# Patient Record
Sex: Male | Born: 1985 | Race: Black or African American | Hispanic: No | Marital: Single | Smoking: Never smoker
Health system: Southern US, Community
[De-identification: ages and names within clinical notes are randomized; demographics above are authoritative.]

## PROBLEM LIST (undated history)

## (undated) DIAGNOSIS — A809 Acute poliomyelitis, unspecified: Secondary | ICD-10-CM

## (undated) DIAGNOSIS — A1801 Tuberculosis of spine: Secondary | ICD-10-CM

## (undated) HISTORY — PX: ORIF FEMUR FRACTURE: SHX2119

## (undated) HISTORY — PX: SPINAL FUSION: SHX223

## (undated) HISTORY — PX: TIBIA IM NAIL INSERTION: SHX2516

---

## 2006-01-04 ENCOUNTER — Emergency Department (HOSPITAL_COMMUNITY): Admission: EM | Admit: 2006-01-04 | Discharge: 2006-01-04 | Payer: Self-pay | Admitting: Emergency Medicine

## 2007-07-08 IMAGING — CR DG HIP (WITH OR WITHOUT PELVIS) 2-3V*L*
3 series · 3 of 3 positions shown · non-contrast
Comparison: None.

CLINICAL DATA: Hip pain.  Congenital defect.  Question dislocation.  
LEFT HIP ? 3 VIEW:

[view not recorded (1 of 3)]
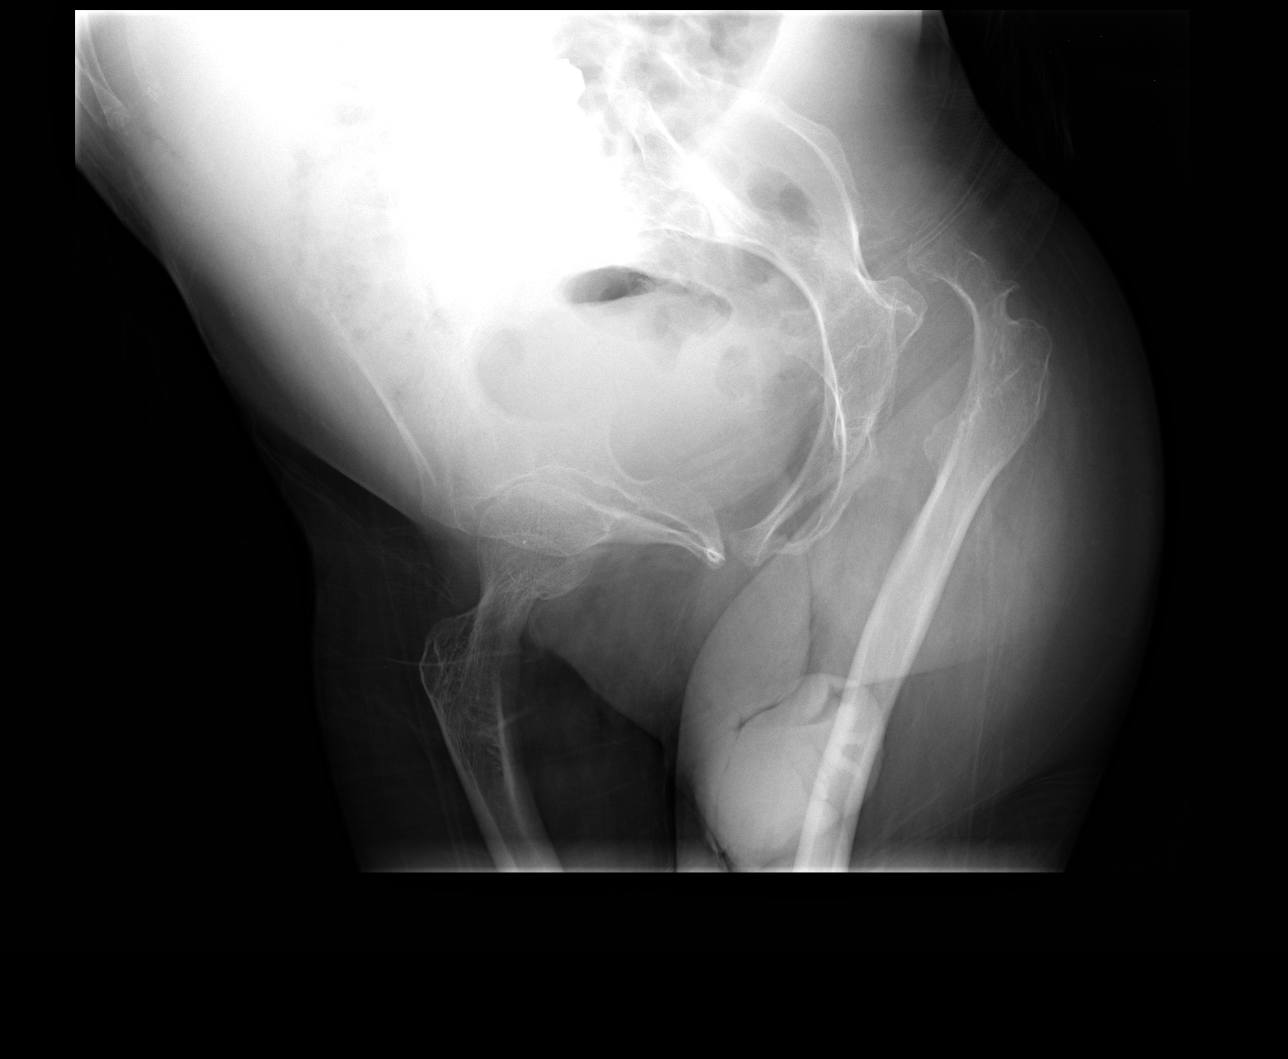

[view not recorded (2 of 3)]
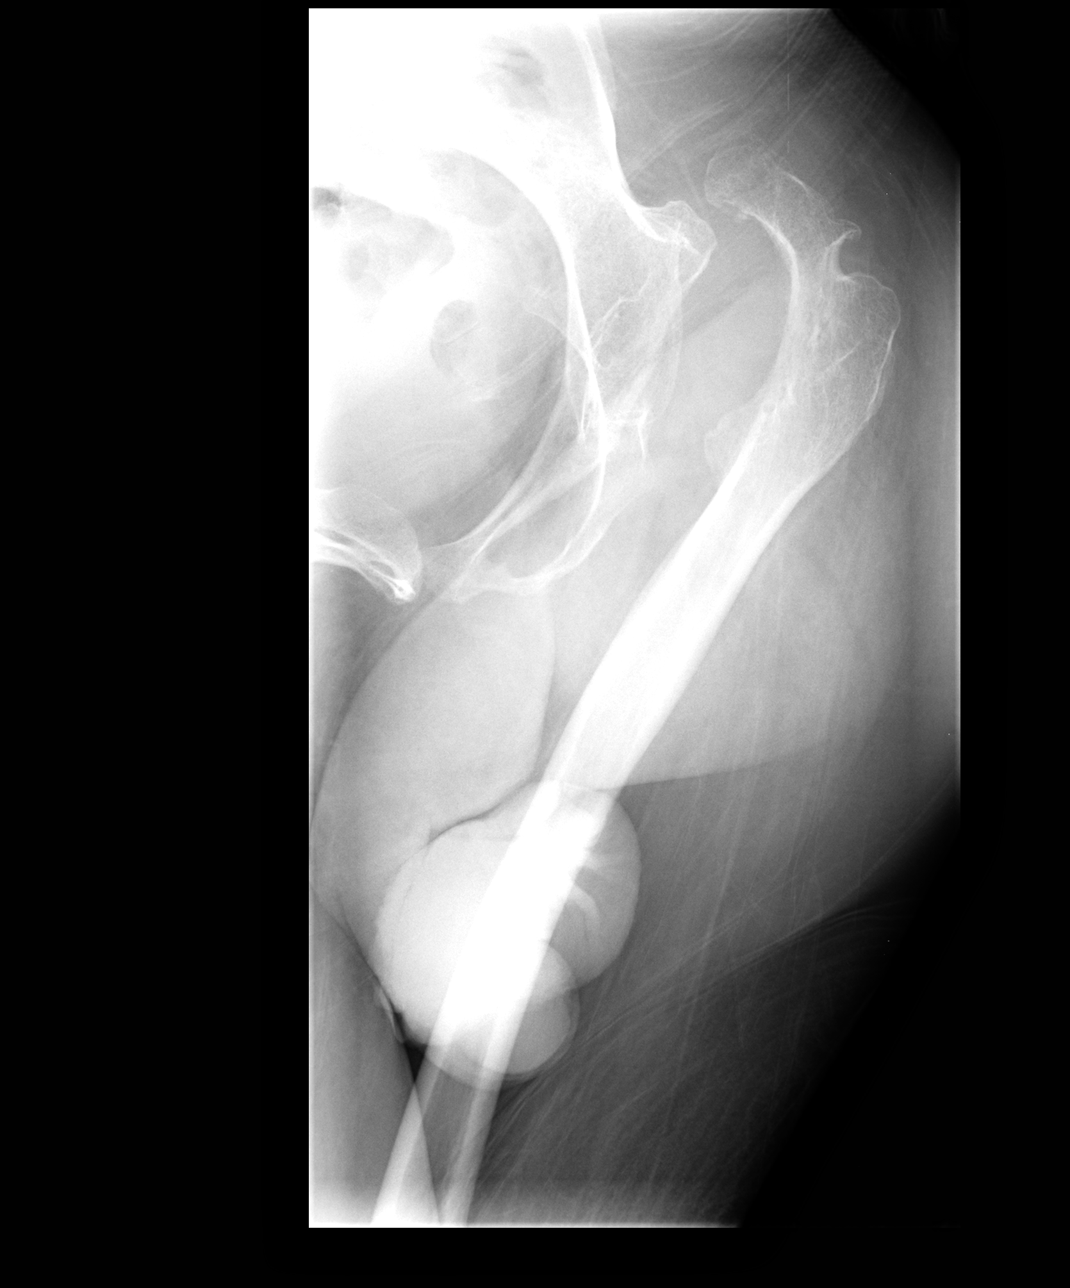

[view not recorded (3 of 3)]
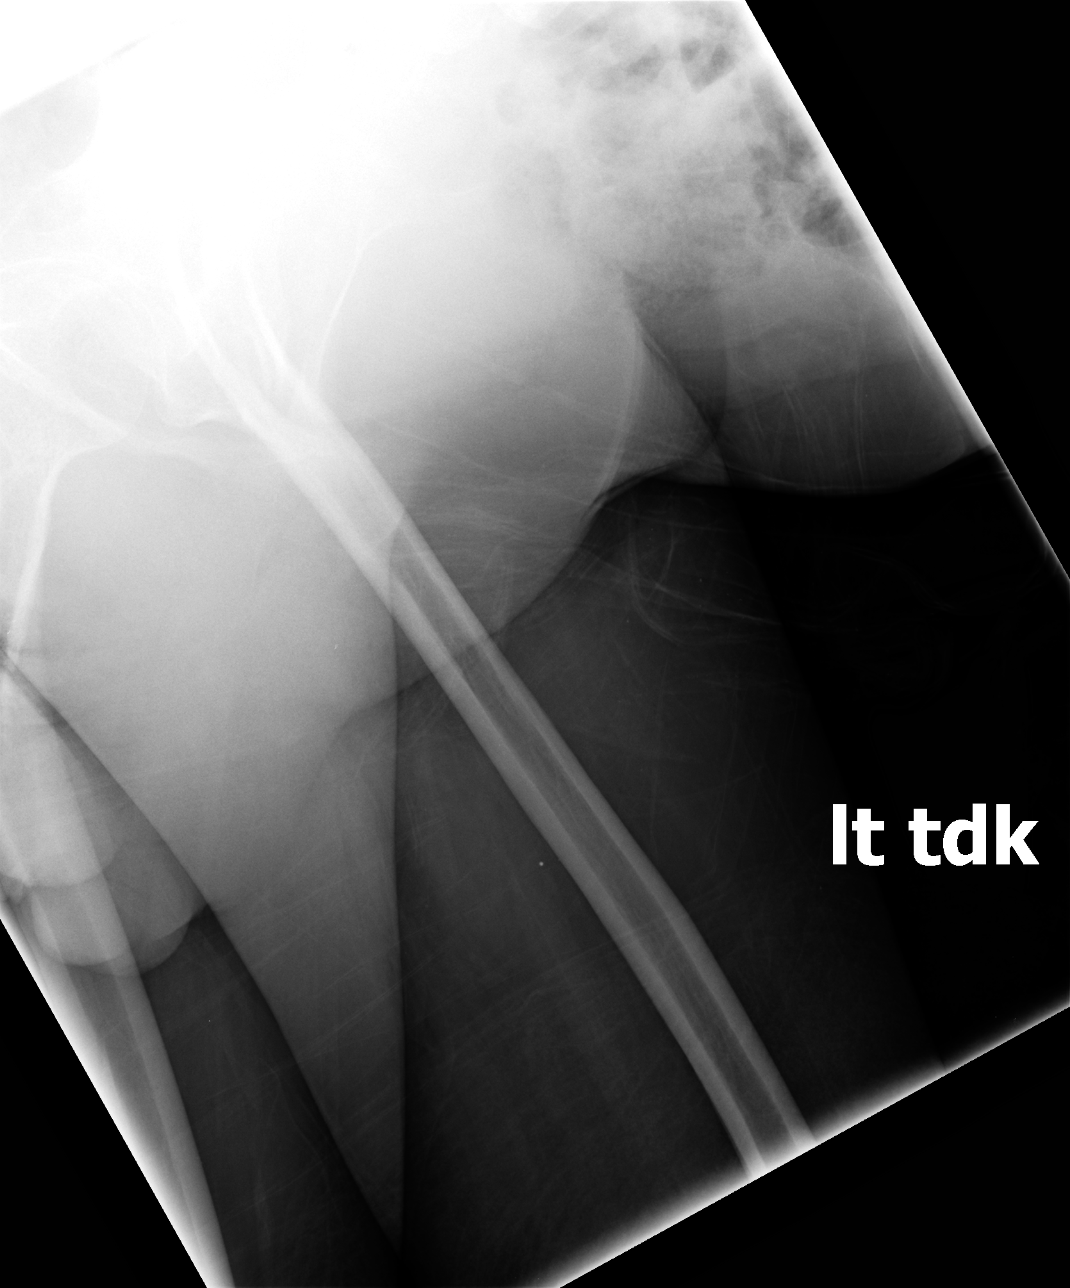

[3 of 3 positions shown; findings below may reference images not displayed]

FINDINGS: Marked osteopenia.  Markedly distorted pelvis consistent with a given history of congenital defects.  There is superolateral position of the left femur likely within a pseudoarthrosis above the left acetabulum.  Abnormal appearance of the left femoral head consistent with congenital mal-positioning.
IMPRESSION: 1.  The left femur appears to partially articulate with pseudoarthrosis superolateral to the left acetabulum.   
2.    Dysmorphic pelvis without evidence of acute fracture.  orphology, this is likely at least a partially chronic finding.  No acute fracture.

## 2019-01-12 ENCOUNTER — Other Ambulatory Visit: Payer: Self-pay

## 2019-01-12 DIAGNOSIS — Z20822 Contact with and (suspected) exposure to covid-19: Secondary | ICD-10-CM

## 2019-01-13 LAB — NOVEL CORONAVIRUS, NAA: SARS-CoV-2, NAA: NOT DETECTED

## 2019-01-26 ENCOUNTER — Other Ambulatory Visit: Payer: Self-pay

## 2019-01-26 DIAGNOSIS — Z20822 Contact with and (suspected) exposure to covid-19: Secondary | ICD-10-CM

## 2019-01-29 LAB — NOVEL CORONAVIRUS, NAA: SARS-CoV-2, NAA: NOT DETECTED

## 2019-02-27 ENCOUNTER — Ambulatory Visit: Payer: Managed Care, Other (non HMO) | Attending: Internal Medicine

## 2019-02-27 DIAGNOSIS — Z20822 Contact with and (suspected) exposure to covid-19: Secondary | ICD-10-CM

## 2019-03-01 LAB — NOVEL CORONAVIRUS, NAA: SARS-CoV-2, NAA: NOT DETECTED

## 2019-05-25 ENCOUNTER — Ambulatory Visit: Payer: Managed Care, Other (non HMO) | Attending: Internal Medicine

## 2019-05-25 DIAGNOSIS — Z23 Encounter for immunization: Secondary | ICD-10-CM

## 2019-05-25 NOTE — Progress Notes (Signed)
   Covid-19 Vaccination Clinic  Name:  Alexander Mcgrath    MRN: [id number] DOB: Aug 05, 1985  05/25/2019  [name] was observed post Covid-19 immunization for 15 minutes without incident. He was provided with Vaccine Information Sheet and instruction to access the V-Safe system.   [name] was instructed to call 911 with any severe reactions post vaccine: Marland Kitchen Difficulty breathing  . Swelling of face and throat  . A fast heartbeat  . A bad rash all over body  . Dizziness and weakness   Immunizations Administered    Name Date Dose VIS Date Route   Pfizer COVID-19 Vaccine 05/25/2019  4:43 PM 0.3 mL 02/16/2019 Intramuscular   Manufacturer: [employer], [employer]   Lot: [id number]   NDC: [address]-7

## 2019-06-20 ENCOUNTER — Ambulatory Visit: Payer: Managed Care, Other (non HMO) | Attending: Internal Medicine

## 2019-06-20 DIAGNOSIS — Z23 Encounter for immunization: Secondary | ICD-10-CM

## 2019-06-20 NOTE — Progress Notes (Signed)
   Covid-19 Vaccination Clinic  Name:  Alexander Mcgrath    MRN: [id number] DOB: 1985/06/08  06/20/2019  [name] was observed post Covid-19 immunization for 15 minutes without incident. He was provided with Vaccine Information Sheet and instruction to access the V-Safe system.   [name] was instructed to call 911 with any severe reactions post vaccine: Marland Kitchen Difficulty breathing  . Swelling of face and throat  . A fast heartbeat  . A bad rash all over body  . Dizziness and weakness   Immunizations Administered    Name Date Dose VIS Date Route   Pfizer COVID-19 Vaccine 06/20/2019  3:37 PM 0.3 mL 02/16/2019 Intramuscular   Manufacturer: [employer], [employer]   Lot: [id number]   NDC: [address]-5

## 2019-12-15 DIAGNOSIS — M778 Other enthesopathies, not elsewhere classified: Secondary | ICD-10-CM | POA: Diagnosis not present

## 2019-12-15 DIAGNOSIS — M25511 Pain in right shoulder: Secondary | ICD-10-CM | POA: Diagnosis not present

## 2020-05-14 DIAGNOSIS — Z20822 Contact with and (suspected) exposure to covid-19: Secondary | ICD-10-CM | POA: Diagnosis not present

## 2020-05-14 DIAGNOSIS — Z03818 Encounter for observation for suspected exposure to other biological agents ruled out: Secondary | ICD-10-CM | POA: Diagnosis not present

## 2020-07-07 DIAGNOSIS — M25511 Pain in right shoulder: Secondary | ICD-10-CM | POA: Diagnosis not present

## 2020-07-18 DIAGNOSIS — S46011D Strain of muscle(s) and tendon(s) of the rotator cuff of right shoulder, subsequent encounter: Secondary | ICD-10-CM | POA: Diagnosis not present

## 2021-05-22 DIAGNOSIS — T24231A Burn of second degree of right lower leg, initial encounter: Secondary | ICD-10-CM | POA: Diagnosis not present

## 2022-10-08 ENCOUNTER — Other Ambulatory Visit: Payer: Self-pay

## 2022-10-08 ENCOUNTER — Emergency Department (HOSPITAL_BASED_OUTPATIENT_CLINIC_OR_DEPARTMENT_OTHER): Payer: BC Managed Care – PPO

## 2022-10-08 ENCOUNTER — Emergency Department (HOSPITAL_BASED_OUTPATIENT_CLINIC_OR_DEPARTMENT_OTHER)
Admission: EM | Admit: 2022-10-08 | Discharge: 2022-10-08 | Disposition: A | Discharge status: Home / Self Care | Payer: BC Managed Care – PPO | Attending: Emergency Medicine | Admitting: Emergency Medicine

## 2022-10-08 DIAGNOSIS — R202 Paresthesia of skin: Secondary | ICD-10-CM | POA: Diagnosis not present

## 2022-10-08 DIAGNOSIS — R200 Anesthesia of skin: Secondary | ICD-10-CM | POA: Diagnosis not present

## 2022-10-08 DIAGNOSIS — R531 Weakness: Secondary | ICD-10-CM | POA: Insufficient documentation

## 2022-10-08 DIAGNOSIS — R519 Headache, unspecified: Secondary | ICD-10-CM | POA: Diagnosis not present

## 2022-10-08 LAB — CBC
HCT: 41.8 % (ref 39.0–52.0)
Hemoglobin: 14.1 g/dL (ref 13.0–17.0)
MCH: 29.9 pg (ref 26.0–34.0)
MCHC: 33.7 g/dL (ref 30.0–36.0)
MCV: 88.7 fL (ref 80.0–100.0)
Platelets: 250 10*3/uL (ref 150–400)
RBC: 4.71 MIL/uL (ref 4.22–5.81)
RDW: 12.5 % (ref 11.5–15.5)
WBC: 7.2 10*3/uL (ref 4.0–10.5)
nRBC: 0 % (ref 0.0–0.2)

## 2022-10-08 LAB — BASIC METABOLIC PANEL
Anion gap: 10 (ref 5–15)
BUN: 10 mg/dL (ref 6–20)
CO2: 25 mmol/L (ref 22–32)
Calcium: 9.7 mg/dL (ref 8.9–10.3)
Chloride: 105 mmol/L (ref 98–111)
Creatinine, Ser: 0.55 mg/dL — ABNORMAL LOW (ref 0.61–1.24)
GFR, Estimated: >60 mL/min (ref >60)
Glucose, Bld: 92 mg/dL (ref 70–99)
Potassium: 3.8 mmol/L (ref 3.5–5.1)
Sodium: 140 mmol/L (ref 135–145)

## 2022-10-08 LAB — CBG MONITORING, ED: Glucose-Capillary: 81 mg/dL (ref 70–99)

## 2022-10-08 MED ORDER — SODIUM CHLORIDE 0.9 % IV BOLUS
1000.0000 mL | Freq: Once | INTRAVENOUS | Status: AC
  Administered 2022-10-08: 1000 mL via INTRAVENOUS

## 2022-10-08 NOTE — ED Notes (Signed)
Provider at bedside for evaluation.

## 2022-10-08 NOTE — Discharge Instructions (Signed)
Please return if symptoms worsen as discussed.  Follow-up with neurology.

## 2022-10-08 NOTE — ED Notes (Signed)
Unable to obtain EKG at triage will try again when the patient is in the exam room.

## 2022-10-08 NOTE — ED Provider Notes (Addendum)
Alexander Mcgrath Provider Note   CSN: [id number] Arrival date & time: 10/08/22  1808     History  Chief Complaint  Patient presents with   Numbness   Weakness    Alexander Mcgrath is a 37 y.o. male.  Patient here with numbness.  Having some numbness to his left lower leg around the left inner calf, some tingling and numbness in his right and left hand.  Had some jaw pain yesterday that resolved, some generalized weakness but no unilateral weakness.  History of spinal TB and polio.  He denies any headache fever or chills.  Nothing makes it worse or better.  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  Is wheelchair-bound.  Uses a manual wheelchair.  Denies any chest pain or shortness of breath.  Denies any back pain or neck pain.  Denies any vision loss or speech changes.  The history is provided by the patient.       Home Medications Prior to Admission medications   Not on File      Allergies    Patient has no known allergies.    Review of Systems   Review of Systems  Physical Exam Updated Vital Signs BP 123/81   Pulse 69   Temp 97.9 F (36.6 C) (Oral)   Resp 18   Ht 5\' 1"  (1.549 m)   Wt 45.4 kg   SpO2 100%   BMI 18.89 kg/m  Physical Exam Vitals and nursing note reviewed.  Constitutional:      General: He is not in acute distress.    Appearance: He is well-developed. He is not ill-appearing.  HENT:     Head: Normocephalic and atraumatic.     Nose: Nose normal.     Mouth/Throat:     Mouth: Mucous membranes are moist.  Eyes:     Extraocular Movements: Extraocular movements intact.     Conjunctiva/sclera: Conjunctivae normal.     Pupils: Pupils are equal, round, and reactive to light.  Cardiovascular:     Rate and Rhythm: Normal rate and regular rhythm.     Pulses: Normal pulses.     Heart sounds: Normal heart sounds. No murmur heard. Pulmonary:     Effort: Pulmonary effort is normal. No respiratory distress.      Breath sounds: Normal breath sounds.  Abdominal:     Palpations: Abdomen is soft.     Tenderness: There is no abdominal tenderness.  Musculoskeletal:        General: No swelling.     Cervical back: Normal range of motion and neck supple. No tenderness.  Skin:    General: Skin is warm and dry.     Capillary Refill: Capillary refill takes less than 2 seconds.  Neurological:     General: No focal deficit present.     Mental Status: He is alert.     Comments: Overall he has 5+ out of 5 strength in his upper extremities, seems like he has symmetric strength in his lower extremities, has had a little bit of numbness to the left medial calf and a little bit of numbness to his left and right thumb but no other obvious numbness on exam, speech is normal, visual fields are normal, cranial nerves are intact  Psychiatric:        Mood and Affect: Mood normal.     ED Results / Procedures / Treatments   Labs (all labs ordered are listed, but only abnormal  results are displayed) Labs Reviewed  BASIC METABOLIC PANEL - Abnormal; Notable for the following components:      Result Value   Creatinine, Ser 0.55 (*)    All other components within normal limits  CBC  CBG MONITORING, ED    EKG None  Radiology CT Head Wo Contrast  Result Date: 10/08/2022 CLINICAL DATA:  Headache. EXAM: CT HEAD WITHOUT CONTRAST TECHNIQUE: Contiguous axial images were obtained from the base of the skull through the vertex without intravenous contrast. RADIATION DOSE REDUCTION: This exam was performed according to the departmental dose-optimization program which includes automated exposure control, adjustment of the mA and/or kV according to patient size and/or use of iterative reconstruction technique. COMPARISON:  No comparison studies available. FINDINGS: Brain: There is no evidence for acute hemorrhage, hydrocephalus, mass lesion, or abnormal extra-axial fluid collection. No definite CT evidence for acute infarction.  Vascular: No hyperdense vessel or unexpected calcification. Skull: No evidence for fracture. No worrisome lytic or sclerotic lesion. Sinuses/Orbits: The visualized paranasal sinuses and mastoid air cells are clear. Visualized portions of the globes and intraorbital fat are unremarkable. Other: None. IMPRESSION: Unremarkable head CT. No acute intracranial abnormality. Electronically Signed   By: Kennith Center M.D.   On: 10/08/2022 19:11    Procedures Procedures    Medications Ordered in ED Medications  sodium chloride 0.9 % bolus 1,000 mL (0 mLs Intravenous Stopped 10/08/22 2042)    ED Course/ Medical Decision Making/ A&P                                 Medical Decision Making Amount and/or Complexity of Data Reviewed Labs: ordered. Radiology: ordered.   Alexander Mcgrath is here with numbness and tingling.  Normal vitals.  No fever.  History of spinal TB and polio.  He is wheelchair-bound.  Overall describes patchy numbness in his hands and left lower leg last few days starting yesterday morning.  Mostly resolved but still with some tingling in his hands at times in his left inner calf.  Feels some generalized weakness when doing transfers from his wheelchair.  Works as a Psychologist, occupational.  His neuroexam is overall unremarkable.  He has some decrease sensation in the left inner calf when compared to his right leg.  Maybe some numbness in his thumbs.  Overall pattern does not appear consistent with stroke.  Does not appear consistent with spinal process.  Does not have any neck pain or back pain.  I do not have any concern for cord compression or stroke at this time.  Seems unlikely to be something like MS.  He had a head CT done that was unremarkable per radiology report.  Basic labs show no significant anemia or electrolyte abnormality or kidney injury.  Felt better after IV fluids.  Overall shared decision was made to hold off on any further imaging including MRI.  We discussed getting an MRI to further  rule out stroke but ultimately decision was made to hold off on that and refer him to neurology outpatient.  Numbness in his hands could be from work as he works as a Psychologist, occupational.  He uses a manual wheelchair.  Could be some median nerve neuropathy or peripheral neuropathy causing this.  MS seems less likely but given the patchy numbness may be something that could be considered outpatient.  Overall he was given return precautions.  He will be referred to neurology.  Discharged in good condition.  Understands return precautions.  I have no concern for infectious process.  This chart was dictated using voice recognition software.  Despite best efforts to proofread,  errors can occur which can change the documentation meaning.         Final Clinical Impression(s) / ED Diagnoses Final diagnoses:  Numbness    Rx / DC Orders ED Discharge Orders          Ordered    Ambulatory referral to Neurology       Comments: An appointment is requested in approximately: 2 weeks   10/08/22 2107              Virgina Norfolk, DO 10/08/22 2111    Virgina Norfolk, DO 10/08/22 2111

## 2022-10-08 NOTE — ED Triage Notes (Addendum)
Pt to ED c/o numbness and generalized weakness that started yesterday at 5am. Reports numbness in both hands and left leg. Pt reports difficulty transferring self to chair due to weakness. Pt A&O x 4 in triage. No s/s of acute distress noted in triage. Pt reports jaw pain yesterday that has now gotten better.

## 2022-10-08 NOTE — ED Notes (Signed)
Reviewed AVS with patient, patient expressed understanding of directions, denies further questions at this time. 

## 2022-10-12 ENCOUNTER — Emergency Department (HOSPITAL_COMMUNITY): Payer: BC Managed Care – PPO

## 2022-10-12 ENCOUNTER — Encounter (HOSPITAL_COMMUNITY): Payer: Self-pay

## 2022-10-12 ENCOUNTER — Other Ambulatory Visit: Payer: Self-pay

## 2022-10-12 ENCOUNTER — Inpatient Hospital Stay (HOSPITAL_COMMUNITY): Payer: BC Managed Care – PPO

## 2022-10-12 ENCOUNTER — Inpatient Hospital Stay (HOSPITAL_COMMUNITY)
Admission: EM | Admit: 2022-10-12 | Discharge: 2022-10-19 | DRG: 096 | Disposition: A | Discharge status: Rehabilitation Facility | Payer: BC Managed Care – PPO | Attending: Infectious Diseases | Admitting: Infectious Diseases

## 2022-10-12 DIAGNOSIS — Z1152 Encounter for screening for COVID-19: Secondary | ICD-10-CM | POA: Diagnosis not present

## 2022-10-12 DIAGNOSIS — K5909 Other constipation: Secondary | ICD-10-CM | POA: Diagnosis not present

## 2022-10-12 DIAGNOSIS — M4802 Spinal stenosis, cervical region: Secondary | ICD-10-CM | POA: Diagnosis not present

## 2022-10-12 DIAGNOSIS — Z79899 Other long term (current) drug therapy: Secondary | ICD-10-CM | POA: Diagnosis not present

## 2022-10-12 DIAGNOSIS — M50323 Other cervical disc degeneration at C6-C7 level: Secondary | ICD-10-CM | POA: Diagnosis not present

## 2022-10-12 DIAGNOSIS — R29810 Facial weakness: Secondary | ICD-10-CM | POA: Diagnosis not present

## 2022-10-12 DIAGNOSIS — R471 Dysarthria and anarthria: Secondary | ICD-10-CM | POA: Diagnosis not present

## 2022-10-12 DIAGNOSIS — R200 Anesthesia of skin: Secondary | ICD-10-CM

## 2022-10-12 DIAGNOSIS — Z993 Dependence on wheelchair: Secondary | ICD-10-CM

## 2022-10-12 DIAGNOSIS — Z681 Body mass index (BMI) 19 or less, adult: Secondary | ICD-10-CM | POA: Diagnosis not present

## 2022-10-12 DIAGNOSIS — F419 Anxiety disorder, unspecified: Secondary | ICD-10-CM | POA: Diagnosis present

## 2022-10-12 DIAGNOSIS — R4702 Dysphasia: Secondary | ICD-10-CM | POA: Diagnosis not present

## 2022-10-12 DIAGNOSIS — Z8612 Personal history of poliomyelitis: Secondary | ICD-10-CM | POA: Diagnosis not present

## 2022-10-12 DIAGNOSIS — R531 Weakness: Secondary | ICD-10-CM

## 2022-10-12 DIAGNOSIS — Z981 Arthrodesis status: Secondary | ICD-10-CM | POA: Diagnosis not present

## 2022-10-12 DIAGNOSIS — R1310 Dysphagia, unspecified: Secondary | ICD-10-CM | POA: Diagnosis present

## 2022-10-12 DIAGNOSIS — G610 Guillain-Barre syndrome: Secondary | ICD-10-CM | POA: Diagnosis not present

## 2022-10-12 DIAGNOSIS — R202 Paresthesia of skin: Secondary | ICD-10-CM | POA: Diagnosis not present

## 2022-10-12 DIAGNOSIS — Z8611 Personal history of tuberculosis: Secondary | ICD-10-CM

## 2022-10-12 DIAGNOSIS — M50223 Other cervical disc displacement at C6-C7 level: Secondary | ICD-10-CM | POA: Diagnosis not present

## 2022-10-12 DIAGNOSIS — H538 Other visual disturbances: Secondary | ICD-10-CM | POA: Diagnosis not present

## 2022-10-12 DIAGNOSIS — M419 Scoliosis, unspecified: Secondary | ICD-10-CM | POA: Diagnosis not present

## 2022-10-12 DIAGNOSIS — R4589 Other symptoms and signs involving emotional state: Secondary | ICD-10-CM | POA: Diagnosis not present

## 2022-10-12 DIAGNOSIS — M79601 Pain in right arm: Secondary | ICD-10-CM | POA: Diagnosis present

## 2022-10-12 DIAGNOSIS — M79602 Pain in left arm: Secondary | ICD-10-CM | POA: Diagnosis not present

## 2022-10-12 DIAGNOSIS — R609 Edema, unspecified: Secondary | ICD-10-CM | POA: Diagnosis not present

## 2022-10-12 DIAGNOSIS — E440 Moderate protein-calorie malnutrition: Secondary | ICD-10-CM | POA: Diagnosis not present

## 2022-10-12 DIAGNOSIS — G8220 Paraplegia, unspecified: Secondary | ICD-10-CM | POA: Diagnosis not present

## 2022-10-12 HISTORY — DX: Tuberculosis of spine: A18.01

## 2022-10-12 HISTORY — DX: Acute poliomyelitis, unspecified: A80.9

## 2022-10-12 LAB — TSH: TSH: 4.117 u[IU]/mL (ref 0.350–4.500)

## 2022-10-12 LAB — CBC WITH DIFFERENTIAL/PLATELET
Abs Immature Granulocytes: 0.04 10*3/uL (ref 0.00–0.07)
Basophils Absolute: 0 10*3/uL (ref 0.0–0.1)
Basophils Relative: 1 %
Eosinophils Absolute: 0.2 10*3/uL (ref 0.0–0.5)
Eosinophils Relative: 2 %
HCT: 42.1 % (ref 39.0–52.0)
Hemoglobin: 14.3 g/dL (ref 13.0–17.0)
Immature Granulocytes: 1 %
Lymphocytes Relative: 30 %
Lymphs Abs: 2.4 10*3/uL (ref 0.7–4.0)
MCH: 30.9 pg (ref 26.0–34.0)
MCHC: 34 g/dL (ref 30.0–36.0)
MCV: 90.9 fL (ref 80.0–100.0)
Monocytes Absolute: 0.7 10*3/uL (ref 0.1–1.0)
Monocytes Relative: 8 %
Neutro Abs: 5 10*3/uL (ref 1.7–7.7)
Neutrophils Relative %: 58 %
Platelets: 259 10*3/uL (ref 150–400)
RBC: 4.63 MIL/uL (ref 4.22–5.81)
RDW: 12.9 % (ref 11.5–15.5)
WBC: 8.3 10*3/uL (ref 4.0–10.5)
nRBC: 0 % (ref 0.0–0.2)

## 2022-10-12 LAB — VITAMIN B12: Vitamin B-12: 436 pg/mL (ref 180–914)

## 2022-10-12 LAB — FOLATE: Folate: 7.9 ng/mL (ref >5.9)

## 2022-10-12 LAB — BASIC METABOLIC PANEL
Anion gap: 8 (ref 5–15)
BUN: 8 mg/dL (ref 6–20)
CO2: 24 mmol/L (ref 22–32)
Calcium: 9.5 mg/dL (ref 8.9–10.3)
Chloride: 107 mmol/L (ref 98–111)
Creatinine, Ser: 0.45 mg/dL — ABNORMAL LOW (ref 0.61–1.24)
GFR, Estimated: >60 mL/min (ref >60)
Glucose, Bld: 76 mg/dL (ref 70–99)
Potassium: 3.8 mmol/L (ref 3.5–5.1)
Sodium: 139 mmol/L (ref 135–145)

## 2022-10-12 LAB — C-REACTIVE PROTEIN: CRP: 0.6 mg/dL (ref <1.0)

## 2022-10-12 LAB — RESP PANEL BY RT-PCR (RSV, FLU A&B, COVID)  RVPGX2
Influenza A by PCR: NEGATIVE
Influenza B by PCR: NEGATIVE
Resp Syncytial Virus by PCR: NEGATIVE
SARS Coronavirus 2 by RT PCR: NEGATIVE

## 2022-10-12 LAB — SEDIMENTATION RATE: Sed Rate: 3 mm/hr (ref 0–16)

## 2022-10-12 LAB — HIV ANTIBODY (ROUTINE TESTING W REFLEX): HIV Screen 4th Generation wRfx: NONREACTIVE

## 2022-10-12 LAB — MAGNESIUM: Magnesium: 1.7 mg/dL (ref 1.7–2.4)

## 2022-10-12 LAB — PHOSPHORUS: Phosphorus: 3.3 mg/dL (ref 2.5–4.6)

## 2022-10-12 MED ORDER — MELATONIN 5 MG PO TABS
5.0000 mg | ORAL_TABLET | Freq: Every day | ORAL | Status: DC
  Administered 2022-10-12 – 2022-10-18 (×7): 5 mg via ORAL
  Filled 2022-10-12 (×7): qty 1

## 2022-10-12 MED ORDER — HYDROXYZINE HCL 10 MG PO TABS
10.0000 mg | ORAL_TABLET | Freq: Three times a day (TID) | ORAL | Status: DC | PRN
  Administered 2022-10-12 – 2022-10-13 (×2): 10 mg via ORAL
  Filled 2022-10-12 (×2): qty 1

## 2022-10-12 MED ORDER — GADOBUTROL 1 MMOL/ML IV SOLN
4.0000 mL | Freq: Once | INTRAVENOUS | Status: AC | PRN
  Administered 2022-10-12: 4 mL via INTRAVENOUS

## 2022-10-12 NOTE — Hospital Course (Addendum)
   Bilateral Weakness  He initially presented with paresthesias in both upper and lower extremity , which now progressed to bilateral upper extremity weakness, dysarthria, dysphagia, and facial weakness.  On neuro exam,  patient is unable to use facial muscles to smile or frown. Otherwise facial movement is symmetric.    EKG showed diffuse ST elevation in noncontiguous leads.  Low suspicion for acute pericarditis given patient had no symptoms of chest pain or shortness of breath.  CT of the head, cervical spine and lumbar spine, were  normal.  MRI of the brain which was normal.  Lumbar puncture under fluoroscopy was unsuccessful due to dextroscoliosis.  Given high suspicion for AIDP, he was treated with 5-day course of IVIG.  Numbness and tingling sensation has improved.  He also reports improvement in upper and strength.  He is a good candidate for inpatient rehabilitation. -Outpatient follow-up with Dr. Allena Katz -Outpatient EMG/NCS -Continue B12 1000 mcg daily for goal > 400 - Eye ointment at night and moisture chambers to prevent corneal abrasions given facial diplegia.

## 2022-10-12 NOTE — ED Triage Notes (Signed)
Pt presents from home via ems for c/o numbness and generalized weakness worsening over 5 days. History of polio and spinal TB, pt uses wheelchair. Reports he is now unable to transfer self. Today numbness has spread from extremities to face, now having difficulty with speech and swallowing. Patient able to manage oral secretions. VSS.

## 2022-10-12 NOTE — H&P (Cosign Needed)
Date: 10/12/2022               Patient Name:  Alexander Mcgrath MRN: [id number]  DOB: [birth year] Age / Sex: 37 y.o., male   PCP: Pcp, No         Medical Service: Internal Medicine Teaching Service         Attending Physician: Dr. Gust Rung, DO      First Contact: Dr. Laretta Bolster, MD Pager [phone]    Second Contact: Dr. Olegario Messier, MD Pager [phone]         After Hours (After 5p/  First Contact Pager: [phone]  weekends / holidays): Second Contact Pager: [phone]   SUBJECTIVE   Chief Complaint: Weakness  History of Present Illness:   [name] is a 37 year old male with past medical history of polio, spinal tuberculosis (treated at [facility] in 1994, unable to obtain records), with chronic lower extremity atrophy who presents with concerns of ascending weakness. Pt has had multiple ED visits starting on 8/2. His first symptom was numbness in his lower legs, inner calf, and upper extremities bilaterally. He uses a manual wheelchair, and has normally been able to transfer himself, but today after getting off of work he noticed that he was not able to transfer himself due to his profound weakness. He denies any exacerbating or relieving factors. These symptoms are constant.   He endorses headaches, vision changes. He denies any fevers, chills, chest pain, shortness of breath, change in bowel/urinary habits.   During encounter, he has a hard time communicating, but is able to communicate effectively. He also states that his left eye has become "fuzzy" since symptom onset. He does wear glasses at home, but even with them he still has the same complaint.   ED Course: Code Stroke activated in the emergency department Meds:  Does not take any medication  Past Medical History Polio virus (treated) Spinal TB (Treated)    Past Surgical History:  Procedure Laterality Date   ORIF FEMUR FRACTURE     SPINAL FUSION     TIBIA IM NAIL INSERTION Left      Social:  Lives With: Family Occupation: Welder Support: Family and friends in the area Level of Function: Able to perform most ADLs and IADLs independently, uses wheelchair and is able to transfer himself.  PCP: None Substances: denies any history of smoking cigarrettes or using alcohol.   Family History: Unknown, adopted  Allergies: Allergies as of 10/12/2022   (No Known Allergies)    Review of Systems: A complete ROS was negative except as per HPI.   OBJECTIVE:   Physical Exam: Blood pressure 107/84, pulse 86, temperature 98.1 F (36.7 C), resp. rate (!) 21, height 5\' 1"  (1.549 m), weight 45.4 kg, SpO2 100%.  Constitutional: well-appearing, sitting in bed.  Not in acute distress.   HEENT: normocephalic atraumatic, mucous membranes moist Cardiovascular: regular rate and rhythm, no m/r/g Pulmonary/Chest: Lungs are clear bilaterally. abdominal: soft, non-tender, non-distended MSK: normal bulk and tone Psych: Mood and affect appropriate  Status Mental Status: Patient is awake, alert, oriented to person, place, month, year, and situation. Patient is able to give a coherent clear history. Some dysarthria noted.  Neuro Exam :  II: Visual Fields are full. Pupils are equal, round, and reactive to light.   III,IV, VI: Blurry vision with EOM. IV: Facial sensation is symmetric to temperature VII: Facial movement is symmetric, but he is unable to use facial muscles to smile or frown.  VIII: hearing is intact to voice X: Uvula elevates symmetrically XI: Shoulder shrug is symmetric. XII: tongue is midline without atrophy or fasciculations.  Motor: Chronically decreased bulk and tone in lower extremities. 5/5 strength was present in bilateral upper extremities. Unable to move bilateral lower extremities, bilateral feet contractures.  Sensory: Sensation is symmetric to light touch and temperature in the arms and legs. Cerebellar: FNF intact bilaterally  Labs: CBC     Component Value Date/Time   WBC 7.2 10/08/2022 1838   RBC 4.71 10/08/2022 1838   HGB 14.1 10/08/2022 1838   HCT 41.8 10/08/2022 1838   PLT 250 10/08/2022 1838   MCV 88.7 10/08/2022 1838   MCH 29.9 10/08/2022 1838   MCHC 33.7 10/08/2022 1838   RDW 12.5 10/08/2022 1838     CMP     Component Value Date/Time   NA 140 10/08/2022 1838   K 3.8 10/08/2022 1838   CL 105 10/08/2022 1838   CO2 25 10/08/2022 1838   GLUCOSE 92 10/08/2022 1838   BUN 10 10/08/2022 1838   CREATININE 0.55 (L) 10/08/2022 1838   CALCIUM 9.7 10/08/2022 1838   GFRNONAA >60 10/08/2022 1838    Imaging: Lumbar Spine X-Ray - Post surgical changes, no acute abnormality  EKG: personally reviewed my interpretation is normal sinus rhythm, diffuse ST elevation in noncontiguous leads, lead II, lead III, aVF, V4 and V5. Prior EKG diffuse ST elevation in lead II, III, aVF, V4, V5.   ASSESSMENT & PLAN:   Assessment & Plan by Problem: Principal Problem:   Weakness  Alexander Mcgrath is a 37 y.o. male with remote hx of polio (came to [place] around age 59) and spinal TB presenting with 5 days of numbness in left leg that has now spread to his bilateral upper extremities.    Bilateral Weakness  He initially presented with LLE numbness/tingling and bilateral upper extremity numbness/tingling that has now progressed to bilateral upper extremity weakness, dysarthria, dysphagia, and facial weakness.  Denies recent URI or diarrhea. He also denies recent sick contacts.  On neuro exam,  patient is unable to use facial muscles to smile or frown. Otherwise facial movement is symmetric. He also complains of blurry movement with EOMs.  CT of the head is normal.  EKG shows normal sinus rhythm, with diffuse ST elevation in noncontiguous leads II, III, AVF, V4, and V5.  Possible differential diagnosis include GBS vs.meningitis vs. nutritional deficiency (B6/B12 /or folate).  Alternatively neuromuscular junction disorders is also on the  differential given the bulbar weakness.  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  -Neurology following, appreciate recommendations. - MRI brain and cervical spine. - HIV, RPR -Lumbar Puncture - Vitamin B1, B6, B12/folate - SPEP. - IVIG.    Diet: Normal VTE: Enoxaparin IVF: None,None Code: Full  Prior to Admission Living Arrangement: Home, living   Anticipated Discharge Location: Home Barriers to Discharge: Continued Dispo: Admit patient to Inpatient with expected length of stay greater than 2 midnights.  Signed: Laretta Bolster, M.D.  Internal Medicine Resident, PGY-1 [facility]  Pager: [phone] 6:22 PM, 10/12/2022   **Please contact the on call pager after 5 pm and on weekends at [phone].**  10/12/2022, 5:28 PM

## 2022-10-12 NOTE — ED Provider Notes (Signed)
[place] EMERGENCY DEPARTMENT AT [facility] Provider Note   CSN: [id number] Arrival date & time: 10/12/22  1315     History  Chief Complaint  Patient presents with   Weakness   Numbness    Alexander Mcgrath is a 37 y.o. male with a PMHx of Polio, Potts disease, kyphosis, Spinal TB who presents to the ED with concerns for progressively worsening numbness x 5 days. Symptoms started with left leg paresthesia that appears to be getting better per patient.  Also notes bilateral arm weakness/numbness.  Notes today he has tingling to his face.  Denies any recent cold-like symptoms or sick contacts.  Not currently taking any medications for his polio or spinal TB at this time.  Was previously evaluated by [facility] infectious disease for his polio and spinal TB.  Has associated headache (only when he rolls his eyes), blurred vision (fuzzy vision).  Denies chest pain, shortness of breath, nausea, vomiting.   Per patient chart review: Patient was evaluated in the emergency department on 10/08/2022 for similar symptoms.  Patient had a negative workup at that time consisting of CT head and labs.  The history is provided by the patient. No language interpreter was used.       Home Medications Prior to Admission medications   Medication Sig Start Date End Date Taking? Authorizing Provider  ibuprofen (ADVIL) 200 MG tablet Take 400 mg by mouth every 6 (six) hours as needed for headache or moderate pain.   Yes [provider]      Allergies    Patient has no known allergies.    Review of Systems   Review of Systems  Neurological:  Positive for weakness.  All other systems reviewed and are negative.   Physical Exam Updated Vital Signs BP 119/84   Pulse 76   Temp 98.1 F (36.7 C)   Resp 19   Ht 5\' 1"  (1.549 m)   Wt 45.4 kg   SpO2 100%   BMI 18.89 kg/m  Physical Exam Vitals and nursing note reviewed.  Constitutional:      General: He is not in acute  distress.    Appearance: Normal appearance.  Eyes:     General: No scleral icterus.    Extraocular Movements: Extraocular movements intact.  Cardiovascular:     Rate and Rhythm: Normal rate and regular rhythm.     Pulses: Normal pulses.     Heart sounds: Normal heart sounds.  Pulmonary:     Effort: Pulmonary effort is normal. No respiratory distress.     Breath sounds: Normal breath sounds.  Abdominal:     Palpations: Abdomen is soft. There is no mass.     Tenderness: There is no abdominal tenderness.  Musculoskeletal:        General: Normal range of motion.     Cervical back: Neck supple.  Skin:    General: Skin is warm and dry.     Findings: No rash.  Neurological:     Mental Status: He is alert.     Sensory: Sensation is intact.     Motor: Motor function is intact.     Comments: Negative pronator drift. Strength and sensation intact to BUE. Sensation intact to BLE. Grip strength 5/5 bilaterally.  Unable to perform strength testing of bilateral lower extremities due to patient voicing concerns for increased weakness to the area.  Patient unable to smile on exam however able to stick out his tongue.  Able to speak in clear  complete sentences.  Psychiatric:        Behavior: Behavior normal.     ED Results / Procedures / Treatments   Labs (all labs ordered are listed, but only abnormal results are displayed) Labs Reviewed  BASIC METABOLIC PANEL - Abnormal; Notable for the following components:      Result Value   Creatinine, Ser 0.45 (*)    All other components within normal limits  RESP PANEL BY RT-PCR (RSV, FLU A&B, COVID)  RVPGX2  CSF CULTURE W GRAM STAIN  VITAMIN B12  CBC WITH DIFFERENTIAL/PLATELET  FOLATE  TSH  VITAMIN B1  RPR  HIV ANTIBODY (ROUTINE TESTING W REFLEX)  PROTEIN AND GLUCOSE, CSF  CSF CELL COUNT WITH DIFFERENTIAL  CSF CELL COUNT WITH DIFFERENTIAL  OLIGOCLONAL BANDS, CSF + SERM  DRAW EXTRA CLOT TUBE  IGG CSF INDEX  DRAW EXTRA CLOT TUBE   MENINGITIS/ENCEPHALITIS PANEL (CSF)  VITAMIN B6  PROTEIN ELECTROPHORESIS, SERUM  BASIC METABOLIC PANEL  CBC  C-REACTIVE PROTEIN  SEDIMENTATION RATE  HEAVY METALS, BLOOD  MISC [facility] TEST (SEND OUT)  LYME DISEASE DNA BY PCR([facility])  LYME DISEASE DNA BY PCR([facility])    EKG None  Radiology DG Lumbar Spine Complete  Result Date: 10/12/2022 CLINICAL DATA:  Spinal fusion. EXAM: LUMBAR SPINE - COMPLETE 4+ VIEW COMPARISON:  January 04, 2006. 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 No fracture or spondylolisthesis is noted. Severe dextroscoliosis of thoracic spine is noted. Fusion of multiple disc spaces in the lower thoracic and upper lumbar spine is noted. IMPRESSION: Postsurgical changes as noted above.  No acute abnormality seen. Electronically Signed   By: Lupita Raider M.D.   On: 10/12/2022 16:42    Procedures Procedures    Medications Ordered in ED Medications - No data to display  ED Course/ Medical Decision Making/ A&P Clinical Course as of 10/12/22 1954  Tue Oct 12, 2022  1430 This is a 37 year old gentleman with a history of reported polio as well as "tuberculosis of the spine" he follows with infectious disease at [facility] presenting to the ED with complaint of worsening weakness.  The patient is wheelchair-bound at baseline and reports that he has very little strength in his legs, which are atrophied, but has normal sensation.  Typically he is able to get around in the wheelchair and lift and transition himself in and out of the wheelchair, and he works as a Psychologist, occupational.  Over the past 7 days he has noted progressive worsening of weakness in his arms and now his face.  He said it began with a paresthesia sensation in his left leg which is improved, and subsequently developed weakness in his bilateral hands and a burning sensation about 2 days ago, and now is having a mild frontal headache as well as a weakness in his  face.  He says he has difficulty smiling.  He has never had the symptoms before.  He denies fevers, chills, recent GI symptoms.  He says he has been struggling to transition in and out of his wheelchair which she typically can do easily.  On exam the patient has atrophied symmetrical lower extremities.  He has very weak strength testing on bilateral lower extremities which he says is chronic, approximately 2 out of 5 strength.  He reports sensation is grossly intact in the lower extremities.  He does not have any paresthesias of the abdomen or the chest.  He does report paresthesia sensation in the  bilateral forearms and hands symmetrically, does not have any upper extremity weakness per my exam, but subjectively reports feeling weak.  He is able to stick out his tongue, speak clearly, but cannot smile when I asked him to do so.  I do not see evidence of facial droop.  His speech is clear. [MT]  1432 He was seen in the ED earlier this week and had basic blood work, electrolytes, CT head which were unremarkable.  He returns now today with worsening symptoms.  We will discuss the case with neurology as the patient may need more advanced imaging or intervention, including MS or demyelinating lesion evaluation. We will check a viral swab as well for COVID.  At this time he is maintaining his airway and I have no immediate concern for respiratory failure [MT]  1500 Consult to neurologist, Dr. Derry Lory who recommends MRI Brain/cervical w wo, B12, folate, TSH, thiamine, HIV, RPR testing. Will evaluate in the ED.  [SB]  1622 Pt had reported history of spinal fusion after I consented him for LP. He couldn't recall location of surgeries, and there were no records, so xray obtained, which verifies spinal hardware.  Given this finding I believe the safest LP option would be fluoroscopic guided LP, which has been ordered for inpatient.  Pt is still pending formal neuro consult and MR imaging [MT]  1640 Consult to  hospitalist of [facility], Dr. Thomasene Ripple who will evaluate the patient in the ED.  [SB]  1653 Discussed with patient plans for admission.  Patient agreeable at this time.  Patient per se for admission at this time. [SB]    Clinical Course User Index [MT] Trifan, Kermit Balo, MD [SB] Jeremaih Klima A, PA-C                                 Medical Decision Making Amount and/or Complexity of Data Reviewed Labs: ordered. Radiology: ordered.  Risk Prescription drug management. Decision regarding hospitalization.   Pt presents with progressively worsening weakness/numbness onset 5 days. Vital signs, pt afebrile. On exam, pt with Negative pronator drift. Strength and sensation intact to BUE. Sensation intact to BLE. Grip strength 5/5 bilaterally.  Unable to perform strength testing of bilateral lower extremities due to patient voicing concerns for increased weakness to the area.  Patient unable to smile on exam however able to stick out his tongue.  Able to speak in clear complete sentences. No acute cardiovascular, respiratory, abdominal exam findings. Differential diagnosis includes GBS, meningitis, vitamin deficiency.    Co morbidities that complicate the patient evaluation: Polio, Spinal TB  Additional history obtained:  External records from outside source obtained and reviewed including: Patient was evaluated in the emergency department on 10/08/2022 for similar symptoms.  Patient had a negative workup at that time consisting of CT head and labs.   Labs:  I ordered, and personally interpreted labs.  The pertinent results include:   Negative COVID, Flu, RSV swab B12, TSH, B6, Thiamine, HIV, RPR, Folate ordered with results pending at time of admission.  CBC and BMP ordered with results pending at time of admission  Imaging: I ordered imaging studies including lumbar spine xray, MRI brain/cervical w/wo I independently visualized and interpreted imaging which showed: lumbar spine xray with   Postsurgical changes as noted above.  No acute abnormality seen.   Remainder of images pended at time of admission.  I agree with the radiologist interpretation  Consultations: -I requested  consultation with the Neurologist, Dr. Derry Lory who recommended B12, TSH, B6, thiamine, HIV, RPR, folate, MRI brain/cervical with and without.  Also evaluated patient in the emergency department. -I requested consultation with the hospitalist with [facility], Dr. Thomasene Ripple, and discussed lab and imaging findings as well as pertinent plan - they recommend: will evaluate for admission.   Disposition: Presentation suspicious for progressively worsening numbness and weakness. Lab and imaging studies with results pending at time of admission. After consideration of the diagnostic results and the patients response to treatment, I feel that the patient would benefit from Admission to the hospital.  Discussed with patient plans for admission.  Insoluble questions.  Patient appears safe for admission at this time.   This chart was dictated using voice recognition software, Dragon. Despite the best efforts of this provider to proofread and correct errors, errors may still occur which can change documentation meaning.  Final Clinical Impression(s) / ED Diagnoses Final diagnoses:  Weakness  Numbness    Rx / DC Orders ED Discharge Orders     None         Alayna Mabe A, PA-C 10/12/22 1954    Terald Sleeper, MD 10/13/22 [phone]

## 2022-10-12 NOTE — Consult Note (Addendum)
Neurology Consultation Reason for Consult: weakness Referring Physician: Dr. Renaye Rakers  CC: weakness  History is obtained from: patient  HPI: Engelbert Bencosme is a 37 y.o. male with remote hx of polio (came to [place] around age 51) and spinal TB presenting with 5 days of numbness in left leg that has now spread to his bilateral upper extremities. The patient was seen in the ED for this on 8/2 - no etiology was found (there was no concern for cord compression or stroke at the time) and the patient was referred to neurology as an outpatient.   He presented back to the ED today because his symptoms have now progressed to the point that his upper extremities are weak. He works as a Psychologist, occupational (uses arms/hands a lot) and normally is able to transfer himself in and out of his wheelchair, but has been unable to do so. He is very independent at his baseline - as aforementioned, he works as a Psychologist, occupational, drives, and lives independently.  Additionally, over the last few days he has been having speech difficulties and dysphagia, which is new since 8/2. He is unable to use his facial muscles and is not able to smile or frown. The patient is also having some blurry vision, but no vision loss. He denies any recent illness, fevers, chills, chest pain, SOB, abd pain, n/v/d, urinary incontinence, or any other sxs at this time. Denies any recent vaccines.      ROS: A 14 point ROS was performed and is negative except as noted in the HPI.   Past Medical History:  Diagnosis Date   Polio    TB of vertebral column     History reviewed. No pertinent family history.   Social History:  reports that he has never smoked. He has never used smokeless tobacco. No history on file for alcohol use and drug use.   Exam: Current vital signs: BP 128/67   Pulse 90   Temp 98.1 F (36.7 C)   Resp (!) 22   Ht 5\' 1"  (1.549 m)   Wt 45.4 kg   SpO2 100%   BMI 18.89 kg/m  Vital signs in last 24 hours: Temp:  [98.1 F (36.7 C)]  98.1 F (36.7 C) (08/06 1327) Pulse Rate:  [83-91] 90 (08/06 1415) Resp:  [14-22] 22 (08/06 1415) BP: (122-128)/(67-94) 128/67 (08/06 1415) SpO2:  [100 %] 100 % (08/06 1415) Weight:  [45.4 kg] 45.4 kg (08/06 1329)   Physical Exam  Appears comfortable, in no acute distress.   Neuro: Mental Status: Patient is awake, alert, oriented to person, place, month, year, and situation. Patient is able to give a clear and coherent history. Some dysarthria noted.  Cranial Nerves: II: Visual Fields are full. Pupils are equal, round, and reactive to light.   III,IV, VI: EOMI without ptosis or diploplia.  V: Facial sensation is symmetric to temperature VII: Facial movement is symmetric, but he is unable to use facial muscles to smile or frown.  VIII: hearing is intact to voice X: Uvula elevates symmetrically XI: Shoulder shrug is symmetric. XII: tongue is midline without atrophy or fasciculations.  Motor: Chronically decreased bulk and tone in lower extremities. 5/5 strength was present in bilateral upper extremities. Unable to move bilateral lower extremities, bilateral feet contractures.  Sensory: Sensation is symmetric to light touch and temperature in the arms and legs. Cerebellar: FNF intact bilaterally      I have reviewed labs in epic and the results pertinent to this consultation are: Labs  still need collected at this time.   I have reviewed the images obtained:  CT head 8/2: Unremarkable  Impression: Hikaru Sowerby is a 37 y.o. male with remote hx of polio and spinal TB who presented with LLE numbness/tingling and bilateral upper extremity numbness/tingling that has now progressed to bilateral upper extremity weakness, dysarthria, dysphagia, and facial weakness. Concerned about GBS vs meningitis vs B6/B12/folate deficiency.   Recommendations: 1) Please obtain lumbar puncture under fluoro- labs ordered 2) B1, B6, B12/folate 3) SPEP  4) HIV, RPR 5) MRI brain, MRI  cervical spine  6) Would consider empirically treating for GBS given symptoms 7) Neurology will follow    [name], DO  Internal Medicine Resident, PGY-3   NEUROHOSPITALIST ADDENDUM Performed a face to face diagnostic evaluation.   I have reviewed the contents of history and physical exam as documented by PA/ARNP/Resident and agree with above documentation.  I have discussed and formulated the above plan as documented. Edits to the note have been made as needed.  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  Presenting with rapid ascending paralysis with numbness that has developed over the last few days.  He has mild BL arm weakness distal worse than proximal along with numbness left worse than right. Vibratory sensation is intact BL, coordination is intact. This also seems to affecting his cranial nerves now with slurred speech, incomplete eyelid closure, facial diplegia and difficulty chewing and swallowing. He has BL brachioradialis about 1+ BL but no biceps or triceps reflex.  Differential includes nutritional, autoimmune and infectious etiologies. He is vegetarian and this puts him at risk of B12 deficiency but would not expect this to present this rapidly. 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 Will also check for other heavy metals. No ticks noted on my detailed inspection of his skin including in the armpits, genitals, finger webs. Could be GBS, Lyme, HIV or other vital polyradiculopathy.  Plan is to get LP under fluoro with his significant dextroscoliosis, labs to narrow differential above along with MRI Brain and C spine. If the preliminary workup is not suggestive of infectious process, will consider IVIG or PLEX.  [name] [facility]   [name], MD [facility] [phone]   If 7pm to 7am, please  call on call as listed on AMION.

## 2022-10-12 NOTE — ED Notes (Signed)
Patient transported to MRI 

## 2022-10-12 NOTE — ED Notes (Signed)
ED TO INPATIENT HANDOFF REPORT  ED Nurse Name and Phone #: cori [phone]  S Name/Age/Gender Alexander Mcgrath 37 y.o. male Room/Bed: 012C/012C  Code Status   Code Status: Full Code  Home/SNF/Other Home Patient oriented to: self, place, time, and situation Is this baseline? Yes   Triage Complete: Triage complete  Chief Complaint Weakness [R53.1]  Triage Note Pt presents from home via ems for c/o numbness and generalized weakness worsening over 5 days. History of polio and spinal TB, pt uses wheelchair. Reports he is now unable to transfer self. Today numbness has spread from extremities to face, now having difficulty with speech and swallowing. Patient able to manage oral secretions. VSS.     Allergies No Known Allergies  Level of Care/Admitting Diagnosis ED Disposition     ED Disposition  Admit   Condition  --   Comment  Hospital Area: [facility] [[id number]]  Level of Care: Progressive [102]  Admit to Progressive based on following criteria: NEUROLOGICAL AND NEUROSURGICAL complex patients with significant risk of instability, who do not meet ICU criteria, yet require close observation or frequent assessment (< / = every 2 - 4 hours) with medical / nursing intervention.  May admit patient to [facility] or [facility] if equivalent level of care is available:: Yes  Covid Evaluation: Asymptomatic - no recent exposure (last 10 days) testing not required  Diagnosis: Weakness [241835]  Admitting Physician: Gust Rung [[id number]]  Attending Physician: Gust Rung [[id number]]  Certification:: I certify this patient will need inpatient services for at least 2 midnights  Estimated Length of Stay: 3          B Medical/Surgery History Past Medical History:  Diagnosis Date   Polio    TB of vertebral column    Past Surgical History:  Procedure Laterality Date   ORIF FEMUR FRACTURE     SPINAL FUSION     TIBIA IM NAIL INSERTION Left      A IV  Location/Drains/Wounds Patient Lines/Drains/Airways Status     Active Line/Drains/Airways     Name Placement date Placement time Site Days   Peripheral IV 10/12/22 20 G 1.16" Left;Posterior Forearm 10/12/22  1720  Forearm  less than 1            Intake/Output Last 24 hours No intake or output data in the 24 hours ending 10/12/22 1743  Labs/Imaging Results for orders placed or performed during the hospital encounter of 10/12/22 (from the past 48 hour(s))  Resp panel by RT-PCR (RSV, Flu A&B, Covid) Anterior Nasal Swab     Status: None   Collection Time: 10/12/22  1:56 PM   Specimen: Anterior Nasal Swab  Result Value Ref Range   SARS Coronavirus 2 by RT PCR NEGATIVE NEGATIVE   Influenza A by PCR NEGATIVE NEGATIVE   Influenza B by PCR NEGATIVE NEGATIVE    Comment: (NOTE) The Xpert Xpress SARS-CoV-2/FLU/RSV plus assay is intended as an aid in the diagnosis of influenza from Nasopharyngeal swab specimens and should not be used as a sole basis for treatment. Nasal washings and aspirates are unacceptable for Xpert Xpress SARS-CoV-2/FLU/RSV testing.  Fact Sheet for Patients: BloggerCourse.com  Fact Sheet for Healthcare Providers: SeriousBroker.it  This test is not yet approved or cleared by the [place] FDA and has been authorized for detection and/or diagnosis of SARS-CoV-2 by FDA under an Emergency Use Authorization (EUA). This EUA will remain in effect (meaning this test can be used) for the duration of  the COVID-19 declaration under Section 564(b)(1) of the Act, 21 U.S.C. section 360bbb-3(b)(1), unless the authorization is terminated or revoked.     Resp Syncytial Virus by PCR NEGATIVE NEGATIVE    Comment: (NOTE) Fact Sheet for Patients: BloggerCourse.com  Fact Sheet for Healthcare Providers: SeriousBroker.it  This test is not yet approved or cleared by the [place] FDA and has been authorized for detection and/or diagnosis of SARS-CoV-2 by FDA under an Emergency Use Authorization (EUA). This EUA will remain in effect (meaning this test can be used) for the duration of the COVID-19 declaration under Section 564(b)(1) of the Act, 21 U.S.C. section 360bbb-3(b)(1), unless the authorization is terminated or revoked.  Performed at [facility], [address] [place], [place] [address]    DG Lumbar Spine Complete  Result Date: 10/12/2022 CLINICAL DATA:  Spinal fusion. EXAM: LUMBAR SPINE - COMPLETE 4+ VIEW COMPARISON:  January 04, 2006. 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 No fracture or spondylolisthesis is noted. Severe dextroscoliosis of thoracic spine is noted. Fusion of multiple disc spaces in the lower thoracic and upper lumbar spine is noted. IMPRESSION: Postsurgical changes as noted above.  No acute abnormality seen. Electronically Signed   By: Lupita Raider M.D.   On: 10/12/2022 16:42    Pending Labs Unresulted Labs (From admission, onward)     Start     Ordered   10/13/22 0500  Basic metabolic panel  Tomorrow morning,   R        10/12/22 1720   10/13/22 0500  CBC  Tomorrow morning,   R        10/12/22 1720   10/12/22 1612  Basic metabolic panel  Once,   STAT        10/12/22 1611   10/12/22 1612  CBC with Differential  Once,   STAT        10/12/22 1611   10/12/22 1551  Vitamin B6  Once,   URGENT        10/12/22 1550   10/12/22 1551  Protein electrophoresis, serum  Once,   URGENT        10/12/22 1550   10/12/22 1514  Protein and glucose, CSF  Once,   STAT        10/12/22 1513   10/12/22 1514  CSF cell count with differential  (CSF cell count with differential (x 2 tubes) panel)  Once,   STAT       Question:  Are there also cytology or pathology orders on this specimen?  Answer:  No   10/12/22 1513   10/12/22 1514  CSF cell count with differential  (CSF cell count with  differential (x 2 tubes) panel)  Once,   STAT       Question:  Are there also cytology or pathology orders on this specimen?  Answer:  No   10/12/22 1513   10/12/22 1514  CSF culture w Gram Stain  Once,   URGENT       Question:  Are there also cytology or pathology orders on this specimen?  Answer:  No   10/12/22 1513   10/12/22 1514  Oligoclonal bands, CSF + serum  (Oligoclonal Bands, CSF + Serum panel)  Once,   URGENT        10/12/22 1513   10/12/22 1514  Draw extra clot tube  (Oligoclonal Bands, CSF + Serum panel)  Once,   URGENT  10/12/22 1513   10/12/22 1514  IgG CSF index  (IgG CSF Index, CSF + Serum panel)  Once,   URGENT        10/12/22 1513   10/12/22 1514  Draw extra clot tube  (IgG CSF Index, CSF + Serum panel)  Once,   URGENT        10/12/22 1513   10/12/22 1514  Meningitis/Encephalitis Panel (CSF)  Once,   URGENT        10/12/22 1513   10/12/22 1506  RPR  Once,   URGENT        10/12/22 1506   10/12/22 1506  HIV Antibody (routine testing w rflx)  (HIV Antibody (Routine testing w reflex) panel)  Once,   URGENT        10/12/22 1506   10/12/22 1505  Vitamin B12  Once,   URGENT        10/12/22 1506   10/12/22 1505  Folate  Once,   URGENT        10/12/22 1506   10/12/22 1505  TSH  Once,   URGENT        10/12/22 1506   10/12/22 1505  Vitamin B1  Once,   URGENT        10/12/22 1506            Vitals/Pain Today's Vitals   10/12/22 1533 10/12/22 1545 10/12/22 1600 10/12/22 1630  BP: 119/84 118/61 117/72 107/84  Pulse: 76 87 81 86  Resp: 19 14 19  (!) 21  Temp:      SpO2: 100% 100% 100% 100%  Weight:      Height:      PainSc:        Isolation Precautions No active isolations  Medications Medications - No data to display  Mobility manual wheelchair     Focused Assessments Neuro Assessment Handoff:  Swallow screen pass?  Not attempted         Neuro Assessment: Exceptions to [facility] Neuro Checks:      Has TPA been given? No If patient is a Neuro  Trauma and patient is going to OR before floor call report to 4N Charge nurse: [phone] or [phone]   R Recommendations: See Admitting Provider Note  Report given to:   Additional Notes: n/a

## 2022-10-13 ENCOUNTER — Ambulatory Visit: Payer: Managed Care, Other (non HMO) | Admitting: Sports Medicine

## 2022-10-13 ENCOUNTER — Inpatient Hospital Stay (HOSPITAL_COMMUNITY): Payer: BC Managed Care – PPO

## 2022-10-13 DIAGNOSIS — R531 Weakness: Secondary | ICD-10-CM | POA: Diagnosis not present

## 2022-10-13 MED ORDER — SODIUM CHLORIDE 0.9 % IV BOLUS
1000.0000 mL | INTRAVENOUS | Status: AC
  Administered 2022-10-13 – 2022-10-17 (×5): 1000 mL via INTRAVENOUS

## 2022-10-13 MED ORDER — LIDOCAINE HCL (PF) 1 % IJ SOLN
5.0000 mL | Freq: Once | INTRAMUSCULAR | Status: AC
  Administered 2022-10-13: 5 mL via INTRADERMAL

## 2022-10-13 MED ORDER — HYDROXYZINE HCL 25 MG PO TABS
25.0000 mg | ORAL_TABLET | Freq: Three times a day (TID) | ORAL | Status: DC | PRN
  Administered 2022-10-13 – 2022-10-17 (×6): 25 mg via ORAL
  Filled 2022-10-13 (×6): qty 1

## 2022-10-13 MED ORDER — IBUPROFEN 200 MG PO TABS
600.0000 mg | ORAL_TABLET | Freq: Four times a day (QID) | ORAL | Status: DC | PRN
  Administered 2022-10-13 – 2022-10-18 (×8): 600 mg via ORAL
  Filled 2022-10-13 (×8): qty 3

## 2022-10-13 MED ORDER — ACETAMINOPHEN 325 MG PO TABS
650.0000 mg | ORAL_TABLET | Freq: Four times a day (QID) | ORAL | Status: DC | PRN
  Administered 2022-10-13 – 2022-10-15 (×3): 650 mg via ORAL
  Filled 2022-10-13 (×3): qty 2

## 2022-10-13 MED ORDER — IMMUNE GLOBULIN (HUMAN) 10 GM/100ML IV SOLN
400.0000 mg/kg | INTRAVENOUS | Status: AC
  Administered 2022-10-13 – 2022-10-17 (×5): 20 g via INTRAVENOUS
  Filled 2022-10-13 (×5): qty 200

## 2022-10-13 MED ORDER — GABAPENTIN 300 MG PO CAPS
300.0000 mg | ORAL_CAPSULE | Freq: Two times a day (BID) | ORAL | Status: DC
  Administered 2022-10-13 – 2022-10-19 (×13): 300 mg via ORAL
  Filled 2022-10-13 (×11): qty 1
  Filled 2022-10-13: qty 3
  Filled 2022-10-13: qty 1

## 2022-10-13 NOTE — Progress Notes (Addendum)
Neurology Progress Note  Brief HPI: Alexander Mcgrath is a 37 y.o. male with remote hx of polio (came to [place] around age 65) and spinal TB presenting with 5 days of numbness in left leg that has now spread to his bilateral upper extremities. Symptoms have progressed to bilateral upper extremity weakness, as well as speech difficulties and dysphagia, which is new since 8/2. He is unable to use his facial muscles and is not able to smile or frown. Denies any recent illnesses or any recent vaccines.   Subjective: The patient was seen at the bedside. He reports that he did not sleep well last night because of significant numbness/tingling in his arms that has caused him pain/discomfort. He continues to endorse difficulty with speech and swallowing - he did not eat anything yesterday but did drink some liquids. He notes that he has trouble getting enough suction to use a straw. The patient also continues to have subjective upper extremity weakness.   Exam: Vitals:   10/12/22 2330 10/13/22 0433  BP: 94/65 107/78  Pulse: 79 86  Resp: 17 18  Temp: 97.7 F (36.5 C) 98.1 F (36.7 C)  SpO2: 99% 100%   Gen: In bed, NAD Resp: non-labored breathing, no acute distress Abd: soft, nt  Neuro: Mental Status: awake, alert, oriented to person, place, time, and situation. Able to give clear and coherent history, but speech is slightly dysarthric.  Cranial Nerves: Visual fields are full, PERRL. EOMI. Facial sensation is symmetric, unable to use facial muscles to smile or frown.. Motor: 5/5 strength bilateral upper extremities Sensory: Symmetric to light tough and temperature in arms and legs Gait: Deferred  Pertinent Labs: B12, folate, TSH: WNL Mg, Phos, BMP: unremarkable  Imaging Reviewed: MRI cervical spine: 1. Mild multilevel degenerative disc and joint changes as above. 2. Borderline mild left C3-4 neuroforaminal stenosis. 3. No central canal stenosis.  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 Presenting with rapid ascending paralysis with numbness that has developed over the last few days. He has mild upper extremity weakness, but sensation and coordination is intact. Sxs have also progressed to involving cranial nerves, with slurred speech, facial diplegia and difficulty chewing and swallowing.  Differential includes nutritional, autoimmune and infectious etiologies. B12/folate deficiency ruled out with normal labs. HIV Ab non-reactive. 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 Will also check for other heavy metals. No ticks or tick bites visualized. Could be GBS, Lyme, or other vital polyradiculopathy.   Recommendations: 1) LP under fluoro today given significant dextroscoliosis 2) Follow up labs in process: porphyrins, CMV, coxsackie, heavy metals, lyme dna, manganese levels, [place], SPEP, B6 3) If workup is not suggestive of infectious process, will consider IVIG or PLEX 4) Neurology will follow  Alexander Rafter, DO  Internal Medicine Resident, PGY-3   NEUROHOSPITALIST ADDENDUM Performed a face to face diagnostic evaluation.   I have reviewed the contents of history and physical exam as documented by PA/ARNP/Resident and agree with above documentation.  I have discussed and formulated the above plan as documented. Edits to the note have been made as needed.  Impression/Key exam findings/Plan: unfortunately, unable to get LP. Highest suspicion for AIDP variant. Discussed with patient and his father at the bedside. The fact that we can't get the LP does make it difficult to make an accurate diagnosis. However, given high  suspicion for AIDP, I discussed treatment options including IVIG and PLEX. I discussed how each of these work, their mode of administration, risks and benefits. I also discussed that the diagnosis is still  somewhat unclear at this time. With this limitation, patient opted to proceeed with IVIG.  Will do IVIG daily x 5 doses. First dose ordered for afternoon today.  We will continue to follow along.  Alexander Blinks, MD [facility] [phone]   If 7pm to 7am, please call on call as listed on AMION.

## 2022-10-13 NOTE — Plan of Care (Signed)

## 2022-10-13 NOTE — Evaluation (Signed)
Clinical/Bedside Swallow Evaluation Patient Details  Name: Alexander Mcgrath MRN: [id number] Date of Birth: 04-22-1985  Today's Date: 10/13/2022 Time: SLP Start Time (ACUTE ONLY): 0815 SLP Stop Time (ACUTE ONLY): 0830 SLP Time Calculation (min) (ACUTE ONLY): 15 min  Past Medical History:  Past Medical History:  Diagnosis Date   Polio    TB of vertebral column    Past Surgical History:  Past Surgical History:  Procedure Laterality Date   ORIF FEMUR FRACTURE     SPINAL FUSION     TIBIA IM NAIL INSERTION Left    HPI:  Alexander Mcgrath is a 37 y.o. male with remote hx of polio (came to [place] around age 68) and spinal TB presenting with 5 days of numbness in left leg that has now spread to his bilateral upper extremities. Symptoms have progressed to bilateral upper extremity weakness, as well as speech difficulties and dysphagia, which is new since 8/2. He is unable to use his facial muscles and is not able to smile or frown. Denies any recent illnesses or any recent vaccines.    Assessment / Plan / Recommendation  Clinical Impression  Pt demonstrates a moderate oral dysphagia, appearing primarily related to bilateral facial nerve weakness and sensory impairment impacting tongue. Pt has no buccal tension or labial seal. He is able to achieve adequate lingual ROM and has fairly good lingual tension, though he reports his tongue is numb and articulation is a little imprecise. Mandibular strength good, cough strength good, able to initaite a swallow. Facial nerve deficits primarily impact oral phase; pt cannot get a good seal on a straw, uses his tongue against palate to draw up fluid. Is not attempting a cup given potential for anterior spillage. Pt can feed himself with a spoon and can transit semi solid textures with minimal report of residue. He says sometimes it has been hard to move the food around in his mouth and he has used his finger. SLP encouraged a liquid wash. There were no signs  of aspiration or pharyngeal weakness at this time, though there is potential for further decline. Discussed signs of pharyngeal impairment with pt, who is cognitively intact and able to self monitor. Will resume full liquid for now, encouraged protein shakes to pt. Will f/u daily. SLP Visit Diagnosis: Dysphagia, oral phase (R13.11)    Aspiration Risk  Risk for inadequate nutrition/hydration    Diet Recommendation Thin liquid    Liquid Administration via: Straw;Spoon Medication Administration: Crushed with puree Supervision: Full supervision/cueing for compensatory strategies Compensations: Slow rate;Small sips/bites;Lingual sweep for clearance of pocketing;Follow solids with liquid    Other  Recommendations      Recommendations for follow up therapy are one component of a multi-disciplinary discharge planning process, led by the attending physician.  Recommendations may be updated based on patient status, additional functional criteria and insurance authorization.  Follow up Recommendations Outpatient SLP      Assistance Recommended at Discharge    Functional Status Assessment    Frequency and Duration min 2x/week  2 weeks       Prognosis Prognosis for improved oropharyngeal function: Good      Swallow Study   General HPI: Alexander Mcgrath is a 37 y.o. male with remote hx of polio (came to [place] around age 1) and spinal TB presenting with 5 days of numbness in left leg that has now spread to his bilateral upper extremities. Symptoms have progressed to bilateral upper extremity weakness, as well as speech difficulties and dysphagia, which is  new since 8/2. He is unable to use his facial muscles and is not able to smile or frown. Denies any recent illnesses or any recent vaccines. Type of Study: Bedside Swallow Evaluation Diet Prior to this Study: NPO Temperature Spikes Noted: No Respiratory Status: Room air History of Recent Intubation: No Behavior/Cognition:  Alert;Cooperative;Pleasant mood Oral Cavity Assessment: Within Functional Limits Oral Care Completed by SLP: No Oral Cavity - Dentition: Adequate natural dentition Vision: Functional for self-feeding Self-Feeding Abilities: Able to feed self Patient Positioning: Upright in bed Baseline Vocal Quality: Normal Volitional Cough: Strong Volitional Swallow: Able to elicit    Oral/Motor/Sensory Function Overall Oral Motor/Sensory Function: Moderate impairment Facial ROM: Reduced right;Suspected CN VII (facial) dysfunction;Reduced left Facial Symmetry: Within Functional Limits Facial Strength: Reduced right;Reduced left;Suspected CN VII (facial) dysfunction Lingual ROM: Within Functional Limits Lingual Symmetry: Within Functional Limits Lingual Sensation: Reduced;Suspected CN VII (facial) dysfunction-anterior 2/3 tongue Velum: Within Functional Limits Mandible: Within Functional Limits   Ice Chips Ice chips: Within functional limits Presentation: Spoon   Thin Liquid Thin Liquid: Impaired Presentation: Straw Oral Phase Impairments: Reduced labial seal Oral Phase Functional Implications: Prolonged oral transit    Nectar Thick Nectar Thick Liquid: Not tested   Honey Thick Honey Thick Liquid: Not tested   Puree Puree: Impaired Presentation: Self Fed;Spoon Oral Phase Impairments: Reduced labial seal Oral Phase Functional Implications: Prolonged oral transit   Solid     Solid: Not tested      Rashay Barnette, Riley Nearing 10/13/2022,10:00 AM

## 2022-10-13 NOTE — Progress Notes (Signed)
1130 Pt has left floor with transport to radiology. Family aware.  1328 Pt has returned to room 3W-34 from lumbar puncture with transport. Family at bedside.

## 2022-10-13 NOTE — Progress Notes (Signed)
Pt did NIF with great effort and received greater than -40  Pt did VC with poor effort and received 1.1L  RT will monitor.

## 2022-10-13 NOTE — Progress Notes (Signed)
Pt. Performed -30 on the NIF and .8Liters on the vital capacity. Pt. States he has issues with his lips that prevent him from performing these mechanics at his best.

## 2022-10-13 NOTE — Progress Notes (Signed)
HD#1 Subjective:  Overnight Events: No acute event overnight.  Had MRI that was normal.  Patient was seen at the bedside. He is reporting mild headache.  No visual changes. He is scheduled for LP under fluoroscopy giving significant dextroscoliosis.   Objective:  Vital signs in last 24 hours: Vitals:   10/12/22 2330 10/13/22 0433 10/13/22 0750 10/13/22 1147  BP: 94/65 107/78 (!) 123/111 106/69  Pulse: 79 86 87 90  Resp: 17 18 17 16   Temp: 97.7 F (36.5 C) 98.1 F (36.7 C) 98.3 F (36.8 C) 98.3 F (36.8 C)  TempSrc:  Oral Oral Oral  SpO2: 99% 100% 100% 100%  Weight:      Height:       Supplemental O2: Room Air SpO2: 100 %  Physical Exam:   CV: RRR. No m/r/g. No LE edema Pulmonary: Lungs CTAB. Normal effort. No wheezing or rales. Neuro:  Visual fields are full, PERRL. EOMI. Facial sensation is symmetric, unable to use facial muscles to smile or frown. Motor: 5/5 strength bilateral upper extremities Sensory: Symmetric to light tough and temperature in arms and legs Gait: Deferred  Filed Weights   10/12/22 1329  Weight: 45.4 kg    Intake/Output Summary (Last 24 hours) at 10/13/2022 1304 Last data filed at 10/12/2022 2200 Gross per 24 hour  Intake 100 ml  Output --  Net 100 ml   Net IO Since Admission: 100 mL [10/13/22 1304]  No results for input(s): "GLUCAP" in the last 72 hours.   Pertinent Labs:    Latest Ref Rng & Units 10/13/2022    2:00 AM 10/12/2022    5:31 PM 10/08/2022    6:38 PM  CBC  WBC 4.0 - 10.5 K/uL 7.2  8.3  7.2   Hemoglobin 13.0 - 17.0 g/dL 16.1  09.6  04.5   Hematocrit 39.0 - 52.0 % 39.4  42.1  41.8   Platelets 150 - 400 K/uL 235  259  250        Latest Ref Rng & Units 10/13/2022    2:00 AM 10/12/2022    5:31 PM 10/08/2022    6:38 PM  CMP  Glucose 70 - 99 mg/dL 95  76  92   BUN 6 - 20 mg/dL 9  8  10    Creatinine 0.61 - 1.24 mg/dL 4.09  8.11  9.14   Sodium 135 - 145 mmol/L 137  139  140   Potassium 3.5 - 5.1 mmol/L 3.5  3.8  3.8    Chloride 98 - 111 mmol/L 103  107  105   CO2 22 - 32 mmol/L 24  24  25    Calcium 8.9 - 10.3 mg/dL 9.3  9.5  9.7     Imaging: MR Brain W and Wo Contrast  Result Date: 10/12/2022 CLINICAL DATA:  Left lower extremity numbness. History of polio and tuberculosis. EXAM: MRI HEAD WITHOUT AND WITH CONTRAST TECHNIQUE: Multiplanar, multiecho pulse sequences of the brain and surrounding structures were obtained without and with intravenous contrast. CONTRAST:  4mL GADAVIST GADOBUTROL 1 MMOL/ML IV SOLN COMPARISON:  None Available. FINDINGS: Brain: No acute infarct, mass effect or extra-axial collection. No acute or chronic hemorrhage. Normal white matter signal, parenchymal volume and CSF spaces. The midline structures are normal. Vascular: Major flow voids are preserved. Skull and upper cervical spine: Normal calvarium and skull base. Visualized upper cervical spine and soft tissues are normal. Sinuses/Orbits:No paranasal sinus fluid levels or advanced mucosal thickening. No mastoid or middle ear effusion.  Normal orbits. IMPRESSION: Normal brain MRI. Electronically Signed   By: Deatra Robinson M.D.   On: 10/12/2022 20:09   MR Cervical Spine W and Wo Contrast  Result Date: 10/12/2022 CLINICAL DATA:  Paresthesia. Remote history of polio. Remote history of spinal TB. 5 days of numbness in left leg that has now spread to his bilateral upper extremities. EXAM: MRI CERVICAL SPINE WITHOUT AND WITH CONTRAST TECHNIQUE: Multiplanar and multiecho pulse sequences of the cervical spine, to include the craniocervical junction and cervicothoracic junction, were obtained without and with intravenous contrast. CONTRAST:  4mL GADAVIST GADOBUTROL 1 MMOL/ML IV SOLN COMPARISON:  None Available. FINDINGS: Alignment: There is mild kyphotic angulation centered at C4-5. No sagittal spondylolisthesis. The atlantodens interval is intact. Vertebrae: Vertebral body heights are maintained. Minimal anterior C5-6 and C6-7 disc space narrowing. w  within the limitations of patient motion artifact, no significant bone signal abnormality is seen. Cord: The cervical cord demonstrates normal signal and caliber. No abnormal cord enhancement. Posterior Fossa, vertebral arteries, paraspinal tissues: Negative. Disc levels: C2-3: Mild bilateral uncovertebral hypertrophy. No posterior disc bulge. No central canal or neuroforaminal stenosis. C3-4: Mild bilateral facet joint hypertrophy. Mild left uncovertebral hypertrophy. Borderline mild left neuroforaminal stenosis. The right neural foramen is patent. No central canal stenosis. C4-5: Mild bilateral facet joint hypertrophy. No posterior disc bulge, central canal narrowing, or neuroforaminal stenosis. C5-6: Mild bilateral facet joint hypertrophy. Mild posterior disc bulge with mild bilateral intraforaminal extension. No significant neuroforaminal stenosis. No central canal stenosis. C6-7: Mild posterior disc bulge. CSF is still seen ventral to the cord. No central canal or neuroforaminal stenosis. C7-T1: No posterior disc bulge, central canal narrowing, or neuroforaminal stenosis. T1-2: Unremarkable. IMPRESSION: 1. Mild multilevel degenerative disc and joint changes as above. 2. Borderline mild left C3-4 neuroforaminal stenosis. 3. No central canal stenosis. Electronically Signed   By: Neita Garnet M.D.   On: 10/12/2022 19:50   DG Lumbar Spine Complete  Result Date: 10/12/2022 CLINICAL DATA:  Spinal fusion. EXAM: LUMBAR SPINE - COMPLETE 4+ VIEW COMPARISON:  January 04, 2006. 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 No fracture or spondylolisthesis is noted. Severe dextroscoliosis of thoracic spine is noted. Fusion of multiple disc spaces in the lower thoracic and upper lumbar spine is noted. IMPRESSION: Postsurgical changes as noted above.  No acute abnormality seen. Electronically Signed   By: Lupita Raider M.D.   On: 10/12/2022 16:42    Assessment/Plan:    Principal Problem:   Weakness   Patient Summary: Alexander Mcgrath is a 37 y.o. with a history of polio and spinal TB who presented with LLE numbness/tingling and bilateral upper extremity numbness/tingling that has now progressed to bilateral upper extremity weakness, dysarthria, dysphagia, and facial weakness.  Workup so far unremarkable.  Bilateral Weakness He presented with subjective upper extremity weakness, numbness and tingling that have worsened over the last few days. Imaging with CT and MRI of the cervical spine normal.  MRI of the brain showed no acute abnormality. Possible differential diagnosis includes nutritional, autoimmune,infectious etiology. Vit B12 and folate deficiency ruled out with normal labs. HIV Ab  and RPR is nonreactive. Alternatively, neuromuscular disorders, manganese toxicity, GBS is also on the differential.  -Neurology following, appreciate recommendations. -LP under fluoro today given significant dextroscoliosis  -Follow up labs in process: porphyrins, CMV, coxsackie, heavy metals, lyme dna, manganese levels, [place] n[place], SPEP, B6. - If workup is not suggestive of infectious process, will consider IVIG or PLEX  Anxiety. Hydroxyzine 25 mg as needed. -Monitor.  Neuropathic Pain Reporting numbness and tingling sensation in upper extremities. -Gabapentin 300 mg twice daily.  Diet: Thin Liquid  IVF:  None VTE: SCDs Start: 10/12/22 1719 Code: Full PT/OT: Pending ID:  Anti-infectives (From admission, onward)    None       Anticipated discharge to Home pending.  Laretta Bolster, M.D.  Internal Medicine Resident, PGY-1 [facility]  Pager: [phone] 1:04 PM, 10/13/2022   **Please contact the on call pager after 5 pm and on weekends at [phone].**

## 2022-10-13 NOTE — Progress Notes (Signed)
PROCEDURE SUMMARY:  Unsuccessful fluoro guided lumbar puncture.  LP attempted at L4, L4-L5, L5-S1, unsuccessful.   No immediate complications.  EBL = trace. Patient tolerated well.   Please see imaging section of Epic for full dictation.   Emelyn Roen Rexene Edison Griffon Herberg PA-C 10/13/2022 1:05 PM

## 2022-10-14 DIAGNOSIS — R200 Anesthesia of skin: Secondary | ICD-10-CM

## 2022-10-14 DIAGNOSIS — R531 Weakness: Secondary | ICD-10-CM | POA: Diagnosis not present

## 2022-10-14 LAB — BASIC METABOLIC PANEL
Anion gap: 8 (ref 5–15)
BUN: 7 mg/dL (ref 6–20)
CO2: 23 mmol/L (ref 22–32)
Calcium: 9 mg/dL (ref 8.9–10.3)
Chloride: 108 mmol/L (ref 98–111)
Creatinine, Ser: 0.49 mg/dL — ABNORMAL LOW (ref 0.61–1.24)
GFR, Estimated: >60 mL/min (ref >60)
Glucose, Bld: 134 mg/dL — ABNORMAL HIGH (ref 70–99)
Potassium: 3.6 mmol/L (ref 3.5–5.1)
Sodium: 139 mmol/L (ref 135–145)

## 2022-10-14 LAB — CBC
HCT: 36.2 % — ABNORMAL LOW (ref 39.0–52.0)
Hemoglobin: 12.1 g/dL — ABNORMAL LOW (ref 13.0–17.0)
MCH: 30.4 pg (ref 26.0–34.0)
MCHC: 33.4 g/dL (ref 30.0–36.0)
MCV: 91 fL (ref 80.0–100.0)
Platelets: 221 10*3/uL (ref 150–400)
RBC: 3.98 MIL/uL — ABNORMAL LOW (ref 4.22–5.81)
RDW: 12.7 % (ref 11.5–15.5)
WBC: 5.2 10*3/uL (ref 4.0–10.5)
nRBC: 0 % (ref 0.0–0.2)

## 2022-10-14 LAB — CBC WITH DIFFERENTIAL/PLATELET
Abs Immature Granulocytes: 0.01 10*3/uL (ref 0.00–0.07)
Basophils Absolute: 0.1 10*3/uL (ref 0.0–0.1)
Basophils Relative: 1 %
Eosinophils Absolute: 0.2 10*3/uL (ref 0.0–0.5)
Eosinophils Relative: 4 %
HCT: 39.1 % (ref 39.0–52.0)
Hemoglobin: 13 g/dL (ref 13.0–17.0)
Immature Granulocytes: 0 %
Lymphocytes Relative: 27 %
Lymphs Abs: 1.4 10*3/uL (ref 0.7–4.0)
MCH: 29.7 pg (ref 26.0–34.0)
MCHC: 33.2 g/dL (ref 30.0–36.0)
MCV: 89.5 fL (ref 80.0–100.0)
Monocytes Absolute: 0.4 10*3/uL (ref 0.1–1.0)
Monocytes Relative: 7 %
Neutro Abs: 3.2 10*3/uL (ref 1.7–7.7)
Neutrophils Relative %: 61 %
Platelets: 220 10*3/uL (ref 150–400)
RBC: 4.37 MIL/uL (ref 4.22–5.81)
RDW: 12.7 % (ref 11.5–15.5)
WBC: 5.2 10*3/uL (ref 4.0–10.5)
nRBC: 0 % (ref 0.0–0.2)

## 2022-10-14 LAB — PHOSPHORUS: Phosphorus: 2.4 mg/dL — ABNORMAL LOW (ref 2.5–4.6)

## 2022-10-14 MED ORDER — ADULT MULTIVITAMIN W/MINERALS CH
1.0000 | ORAL_TABLET | Freq: Every day | ORAL | Status: DC
  Administered 2022-10-14 – 2022-10-19 (×6): 1 via ORAL
  Filled 2022-10-14 (×6): qty 1

## 2022-10-14 MED ORDER — K PHOS MONO-SOD PHOS DI & MONO 155-852-130 MG PO TABS
500.0000 mg | ORAL_TABLET | ORAL | Status: AC
  Administered 2022-10-14 – 2022-10-15 (×2): 500 mg via ORAL
  Filled 2022-10-14 (×2): qty 2

## 2022-10-14 NOTE — Progress Notes (Signed)
With excellent effort & technique pt did > -40 NIF VC pt did 1.5 he wasn't able to get a good seal

## 2022-10-14 NOTE — Plan of Care (Signed)
  Problem: Education: Goal: Knowledge of General Education information will improve Description Including pain rating scale, medication(s)/side effects and non-pharmacologic comfort measures Outcome: Progressing   

## 2022-10-14 NOTE — Progress Notes (Signed)
Speech Language Pathology Treatment: Dysphagia  Patient Details Name: Ramsey Bjerk MRN: [id number] DOB: [birth year] Today's Date: 10/14/2022 Time: 7829-5621 SLP Time Calculation (min) (ACUTE ONLY): 25 min  Assessment / Plan / Recommendation Clinical Impression  Pt continue to tolerate thin liquids, he denies hunger and demonstrates good ability to sip from a straw despite no labial seal. Pt has been eating all of his grits and agrees he is ready to try some finely chopped cooked vegetables since his mandible tongue and swallow are all intact. SLP set up suction and provided oral care supplies to help pt clean his mouth as needed after meals given no buccal tension and possible sensory loss. Recommend pills crushed as pt has had some trouble with a large pill. SLP will f/u for tolerance and potential advancement.   HPI HPI: Christpoher Wenger is a 37 y.o. male with remote hx of polio (came to [place] around age 26) and spinal TB presenting with 5 days of numbness in left leg that has now spread to his bilateral upper extremities. Symptoms have progressed to bilateral upper extremity weakness, as well as speech difficulties and dysphagia, which is new since 8/2. He is unable to use his facial muscles and is not able to smile or frown. Denies any recent illnesses or any recent vaccines.      SLP Plan  Continue with current plan of care      Recommendations for follow up therapy are one component of a multi-disciplinary discharge planning process, led by the attending physician.  Recommendations may be updated based on patient status, additional functional criteria and insurance authorization.    Recommendations  Diet recommendations: Dysphagia 2 (fine chop);Thin liquid Liquids provided via: Straw Medication Administration: Crushed with puree Supervision: Patient able to self feed Compensations: Slow rate;Small sips/bites;Lingual sweep for clearance of pocketing;Follow solids with  liquid Postural Changes and/or Swallow Maneuvers: Seated upright 90 degrees                        Dysphagia, oral phase (R13.11)     Continue with current plan of care     Addley Ballinger, Riley Nearing  10/14/2022, 9:51 AM

## 2022-10-14 NOTE — Progress Notes (Addendum)
Neurology Progress Note  Brief HPI: Alexander Mcgrath is a 37 y.o. male with remote hx of polio (came to [place] around age 50) and spinal TB presenting with 5 days of numbness in left leg that has now spread to his bilateral upper extremities. Symptoms have progressed to bilateral upper extremity weakness, as well as speech difficulties and dysphagia, which is new since 8/2. He is unable to use his facial muscles and is not able to smile or frown. Denies any recent illnesses or any recent vaccines.   Subjective: The patient was seen at the bedside with no family present. He had his first dose of IVIG yesterday and tolerated it well and without any reactions. He subjectively feels stronger in his upper extremities, but still is having significant neuropathic pain in his arms/fingers.   Exam: Vitals:   10/13/22 2325 10/14/22 0413  BP: 105/64 115/67  Pulse: 85 82  Resp: 13 15  Temp: 98.2 F (36.8 C) 98.4 F (36.9 C)  SpO2: 100% 100%   Gen: In bed, NAD Resp: non-labored breathing, no acute distress Abd: soft, nt  Neuro: Mental Status: awake, alert, oriented to person, place, time, and situation. Able to give clear and coherent history, but speech is slightly dysarthric.  Cranial Nerves: Visual fields are full, PERRL. EOMI. Facial sensation is symmetric, unable to fully smile or frown, but slightly improved Motor: 5/5 strength bilateral upper extremities Sensory: Symmetric to light tough and temperature in arms and legs Gait: Deferred  Pertinent Labs: B12, folate, TSH: WNL B6: WNL Mg, Phos, BMP: unremarkable  Imaging Reviewed: MRI cervical spine: 1. Mild multilevel degenerative disc and joint changes as above. 2. Borderline mild left C3-4 neuroforaminal stenosis. 3. No central canal stenosis.  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 Presenting with rapid  ascending paralysis with numbness that has developed over the last few days. He has mild upper extremity weakness, but sensation and coordination is intact. Sxs have also progressed to involving cranial nerves, with slurred speech, facial diplegia and difficulty chewing and swallowing.  Differential includes nutritional, autoimmune and infectious etiologies. B12/folate deficiency ruled out with normal labs. HIV Ab non-reactive. 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 Highest suspicion for AIDP variant, although without LP, cannot definitively make the diagnosis. Discussed risks and benefits of IVIG and PLEX with the patient and he opted to proceed with IVIG. First dose on 8/7.   Recommendations: 1) Continue IVIG x 5 days through 8/11 2) Follow up labs in process: porphyrins, CMV, coxsackie, heavy metals, lyme dna, manganese levels, [place] n[place], SPEP 3) PT/OT  4) Neurology will continue to follow  [name], DO  Internal Medicine Resident, PGY-3  NEUROHOSPITALIST ADDENDUM Performed a face to face diagnostic evaluation.   I have reviewed the contents of history and physical exam as documented by PA/ARNP/Resident and agree with above documentation.  I have discussed and formulated the above plan as documented. Edits to the note have been made as needed.  Impression/Key exam findings/Plan: reports mild improvement after 1st dose of IVIG. Was able to tolerate it fine without any issues.  [name], MD [facility] [phone]   If 7pm to 7am, please call on call as listed on AMION.

## 2022-10-14 NOTE — Progress Notes (Addendum)
HD#2 Subjective:  Overnight Events: No acute event overnight.    He reports mild improvement after the first dose of IVIG.  He still has numbness and tingling in the upper extremities.  Objective:  Vital signs in last 24 hours: Vitals:   10/13/22 2003 10/13/22 2325 10/14/22 0413 10/14/22 0806  BP: 110/82 105/64 115/67 125/79  Pulse: 73 85 82 93  Resp: 14 13 15 18   Temp: 98.4 F (36.9 C) 98.2 F (36.8 C) 98.4 F (36.9 C) 98.5 F (36.9 C)  TempSrc: Oral Oral Oral Oral  SpO2: 100% 100% 100% 99%  Weight:      Height:       Supplemental O2: Room Air SpO2: 99 %  Physical Exam:   CV: RRR. No m/r/g. No LE edema Pulmonary: Lungs CTAB. Normal effort. No wheezing or rales. Neuro:  Visual fields are full, PERRL. EOMI. Facial sensation is symmetric, unable to use facial muscles to smile or frown. Motor: 5/5 strength bilateral upper extremities Sensory: Symmetric to light tough and temperature in arms and legs   Filed Weights   10/12/22 1329  Weight: 45.4 kg    Intake/Output Summary (Last 24 hours) at 10/14/2022 1057 Last data filed at 10/13/2022 1828 Gross per 24 hour  Intake 950.65 ml  Output 700 ml  Net 250.65 ml   Net IO Since Admission: 350.65 mL [10/14/22 1057]  No results for input(s): "GLUCAP" in the last 72 hours.   Pertinent Labs:    Latest Ref Rng & Units 10/14/2022    9:05 AM 10/13/2022    2:00 AM 10/12/2022    5:31 PM  CBC  WBC 4.0 - 10.5 K/uL 5.2  7.2  8.3   Hemoglobin 13.0 - 17.0 g/dL 40.9  81.1  91.4   Hematocrit 39.0 - 52.0 % 39.1  39.4  42.1   Platelets 150 - 400 K/uL 220  235  259        Latest Ref Rng & Units 10/14/2022    9:05 AM 10/13/2022    2:00 AM 10/12/2022    5:31 PM  CMP  Glucose 70 - 99 mg/dL 782  95  76   BUN 6 - 20 mg/dL 7  9  8    Creatinine 0.61 - 1.24 mg/dL 9.56  2.13  0.86   Sodium 135 - 145 mmol/L 139  137  139   Potassium 3.5 - 5.1 mmol/L 3.6  3.5  3.8   Chloride 98 - 111 mmol/L 108  103  107   CO2 22 - 32 mmol/L 23  24  24     Calcium 8.9 - 10.3 mg/dL 9.0  9.3  9.5     Imaging: DG FL GUIDED LUMBAR PUNCTURE  Result Date: 10/13/2022 CLINICAL DATA:  37 year old male with remote history of polio and spinal tuberculosis presents with numbness and weakness in bilateral lower extremities which has spread to bilateral upper extremities as well. EXAM: DIAGNOSTIC LUMBAR PUNCTURE UNDER FLUOROSCOPIC GUIDANCE COMPARISON:  None Available. FLUOROSCOPY: Radiation Exposure Index (as provided by the fluoroscopic device): 28.3 mGy Kerma PROCEDURE: Informed consent was obtained from the patient prior to the procedure, including potential risks and complications of bleeding, infection, damage to adjacent structures, CSF leak, headache, allergy, and pain. With the patient prone, the lower back was prepped with Betadine. 1% Lidocaine was used for local anesthesia. Lumbar puncture was attempted at the L3/4 level using a 20 gauge needle, unsuccessful. Second lumbar puncture was attempted at the L4-L5 level, again unsuccessful. Dr. Reche Dixon  was called to the procedure room for assistance. Dr. Reche Dixon attempted lumbar puncture at the L4-L5 level, unsuccessful. Despite optimal needle positioning (example series 4) CSF did not return. Another lumbar puncture attempt was made at the L5-S1 level, patient developed shooting sensation in his left leg, however CSF could not be obtained. Unsuccessful fluoro guided lumbar puncture. The patient tolerated the procedure well and there were no apparent complications. IMPRESSION: Unsuccessful fluoro guided lumbar puncture, as detailed above. Exam complicated by extensive thoracolumbar spine hardware. This procedure was performed by Lawernce Ion, PA-C under the supervision of Jeronimo Greaves, MD. Electronically Signed   By: Jeronimo Greaves M.D.   On: 10/13/2022 13:39    Assessment/Plan:   Principal Problem:   Weakness   Patient Summary: Alexander Mcgrath is a 37 y.o. with a history of polio and spinal TB who presented with  LLE numbness/tingling and bilateral upper extremity numbness/tingling that has now progressed to bilateral upper extremity weakness, dysarthria, dysphagia, and facial weakness.  Workup so far unremarkable.  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  LP under fluoroscopy was unsuccessful due to dextroscoliosis. On our  exam today, he is still unable to smile on frown. Neurology following, with high suspicion for AIDP variant, although definitive diagnosis cannot be made without LP.  Patient opted for treatment with IVIG, received the first dose 8/7.  Will need 5 doses.  -Neurology following, appreciate recommendations. -Continue IVIG x 5 days through 8/11(#2 of 5) -Follow up labs in process: porphyrins, CMV, coxsackie, heavy metals, lyme dna, manganese levels, [place], SPEP, B6. -Monitor CBC, CMP.  Anxiety. Hydroxyzine 25 mg as needed. -Monitor.  Neuropathic Pain Reporting numbness and tingling sensation in upper extremities. -Gabapentin 300 mg twice daily.  Diet: Thin Liquid  IVF:  None VTE: SCDs Start: 10/12/22 1719 Code: Full PT/OT: Pending ID:  Anti-infectives (From admission, onward)    None       Anticipated discharge to Home pending.  Laretta Bolster, M.D.  Internal Medicine Resident, PGY-1 [facility]  Pager: [phone] 10:57 AM, 10/14/2022   **Please contact the on call pager after 5 pm and on weekends at [phone].**

## 2022-10-15 DIAGNOSIS — R531 Weakness: Secondary | ICD-10-CM | POA: Diagnosis not present

## 2022-10-15 NOTE — Plan of Care (Signed)

## 2022-10-15 NOTE — Progress Notes (Signed)
Inpatient Rehab Admissions Coordinator Note:   Per therapy recommendations patient was screened for CIR candidacy by Stephania Fragmin, PT. At this time, pt appears to be a potential candidate for CIR. I will place an order for rehab consult for full assessment, per our protocol.  Please contact me any with questions.Estill Dooms, PT, DPT [phone] 10/15/22 9:54 AM

## 2022-10-15 NOTE — Progress Notes (Addendum)
HD#3 Subjective:  Overnight Events: No acute event overnight.    He is doing well no new complaint.  Reports stable improvement in upper extremity strength. Objective:  Vital signs in last 24 hours: Vitals:   10/15/22 0019 10/15/22 0424 10/15/22 0808 10/15/22 1152  BP: 113/85 (!) 110/55 126/82 113/75  Pulse: 79 79 84 100  Resp: 18 18 18 18   Temp: 98.2 F (36.8 C) 98.7 F (37.1 C) 97.9 F (36.6 C) 98.2 F (36.8 C)  TempSrc: Oral Oral Oral Oral  SpO2: 100% 99% 100% 100%  Weight:      Height:       Supplemental O2: Room Air SpO2: 100 %  Physical Exam:   CV: RRR. No m/r/g. No LE edema Pulmonary: Lungs are clear bilaterally.   neuro:  Visual fields are full, PERRL. EOMI. Facial sensation is symmetric, unable to use facial muscles to smile or frown. Motor: 5/5 strength bilateral upper extremities Sensory: Symmetric to light tough and temperature in arms and legs   Filed Weights   10/12/22 1329  Weight: 45.4 kg    Intake/Output Summary (Last 24 hours) at 10/15/2022 1302 Last data filed at 10/15/2022 0745 Gross per 24 hour  Intake 1372.56 ml  Output 2430 ml  Net -1057.44 ml   Net IO Since Admission: -706.79 mL [10/15/22 1302]  No results for input(s): "GLUCAP" in the last 72 hours.   Pertinent Labs:    Latest Ref Rng & Units 10/14/2022   11:43 PM 10/14/2022    8:00 PM 10/14/2022    9:05 AM  CBC  WBC 4.0 - 10.5 K/uL 5.4  5.2  5.2   Hemoglobin 13.0 - 17.0 g/dL 25.9  56.3  87.5   Hematocrit 39.0 - 52.0 % 36.6  36.2  39.1   Platelets 150 - 400 K/uL 226  221  220        Latest Ref Rng & Units 10/14/2022   11:43 PM 10/14/2022    9:05 AM 10/13/2022    2:00 AM  CMP  Glucose 70 - 99 mg/dL 81  643  95   BUN 6 - 20 mg/dL 9  7  9    Creatinine 0.61 - 1.24 mg/dL 3.29  5.18  8.41   Sodium 135 - 145 mmol/L 140  139  137   Potassium 3.5 - 5.1 mmol/L 3.8  3.6  3.5   Chloride 98 - 111 mmol/L 107  108  103   CO2 22 - 32 mmol/L 25  23  24    Calcium 8.9 - 10.3 mg/dL 8.7  9.0  9.3      Imaging: No results found.  Assessment/Plan:   Principal Problem:   Weakness Active Problems:   Numbness   Patient Summary: Alexander Mcgrath is a 37 y.o. with a history of polio and spinal TB who presented with LLE numbness/tingling and bilateral upper extremity numbness/tingling that has now progressed to bilateral upper extremity weakness, dysarthria, dysphagia, and facial weakness.  Workup so far unremarkable.  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 On physical exam, he is still unable to smile on frown.  CMV PCR, heavy metals, protein electrophoresis were all normal. Will need 3 more days of IVIG.  -Neurology following, appreciate recommendations. -Continue IVIG x 5 days through 8/11(#3 of 5). -PT/OT. -Follow up labs in process: porphyrins, -Monitor CBC, CMP.  Anxiety. Stable. -Hydroxyzine 25 mg PRN.  -Monitor.  Neuropathic Pain -Gabapentin 300 mg twice daily.  Diet: Thin Liquid  IVF:  None VTE: SCDs Start: 10/12/22 1719 Code: Full PT/OT:  CIR ID:  Anti-infectives (From admission, onward)    None       Anticipated discharge to Home pending.  Laretta Bolster, M.D.  Internal Medicine Resident, PGY-1 [facility]  Pager: [phone] 1:02 PM, 10/15/2022   **Please contact the on call pager after 5 pm and on weekends at [phone].**

## 2022-10-15 NOTE — Evaluation (Signed)
Physical Therapy Evaluation Patient Details Name: Alexander Mcgrath MRN: [id number] DOB: Dec 18, 1985 Today's Date: 10/15/2022  History of Present Illness  Pt is a 37 y/o M admitted on 10/12/22 after presenting with c/o numbness & weakness in BLE that has spread to BUE. Highest suspicion for AIDP variant, although without LP, cannot definitively make the diagnosis. PMH: remote hx of polio & spinal tuberculosis, dextroscoliosis  Clinical Impression  Pt seen for PT evaluation with pt agreeable. Pt reports prior to admission he was mod I from w/c level, living alone, working as a Psychologist, occupational. On this date, pt presents with BUE/trunk weakness requiring mod assist for bed>w/c transfers via squat pivot. Pt is able to propel w/c with BUE & mod I. Pt is eager to return to PLOF and motivated to participate in therapy. Pt would benefit from ongoing PT services to maximize independence with mobility to help facilitate return to mod I PLOF.        If plan is discharge home, recommend the following: A lot of help with walking and/or transfers;Assistance with cooking/housework   Can travel by private vehicle        Equipment Recommendations None recommended by PT  Recommendations for Other Services  Rehab consult    Functional Status Assessment Patient has had a recent decline in their functional status and demonstrates the ability to make significant improvements in function in a reasonable and predictable amount of time.     Precautions / Restrictions Precautions Precautions: Fall Restrictions Weight Bearing Restrictions: No      Mobility  Bed Mobility Overal bed mobility: Needs Assistance Bed Mobility: Supine to Sit   Sidelying to sit: Supervision, HOB elevated       General bed mobility comments: uses BUE to move BLE to EOB    Transfers Overall transfer level: Needs assistance Equipment used: None Transfers: Bed to chair/wheelchair/BSC       Squat pivot transfers: Mod assist      General transfer comment: Pt transfers bed>manual w/c on L with mod assist, pt is able to position BLE onto w/c foot rest but requires assistance to lift trunk/buttocks over to w/c 2/2 BUE weakness.    Ambulation/Gait                  Administrator mobility: Yes Wheelchair propulsion: Both upper extremities Wheelchair parts: Independent Distance: 250 ft Wheelchair Assistance Details (indicate cue type and reason): Pt's lightweight manual w/c does not have brakes.   Tilt Bed    Modified Rankin (Stroke Patients Only)       Balance Overall balance assessment: Needs assistance Sitting-balance support: Feet supported Sitting balance-Leahy Scale: Fair                                       Pertinent Vitals/Pain Pain Assessment Pain Assessment: Faces Faces Pain Scale: Hurts a little bit Pain Location: LUE & BLE Pain Descriptors / Indicators: Numbness, Tingling Pain Intervention(s): Monitored during session    Home Living Family/patient expects to be discharged to:: Private residence Living Arrangements: Alone Available Help at Discharge: Family;Available PRN/intermittently Type of Home: House Home Access: Ramped entrance       Home Layout: One level Home Equipment: Wheelchair - manual;Tub bench (vehicle with hand controls)      Prior Function Prior Level of Function : Independent/Modified Independent;Working/employed;Driving  Mobility Comments: Pt is mod I from w/c level, living alone, works as a Psychologist, occupational, drives (Comptroller).       Extremity/Trunk Assessment   Upper Extremity Assessment Upper Extremity Assessment: Generalized weakness;Defer to OT evaluation    Lower Extremity Assessment Lower Extremity Assessment: Generalized weakness (pt with hx of polio, BLE are significantly atrophied, pt is w/c user at baseline.)    Cervical / Trunk  Assessment Cervical / Trunk Assessment:  (hx of dextroscoliosis)  Communication   Communication Communication: No apparent difficulties  Cognition Arousal: Alert Behavior During Therapy: WFL for tasks assessed/performed Overall Cognitive Status: Within Functional Limits for tasks assessed                                          General Comments      Exercises     Assessment/Plan    PT Assessment Patient needs continued PT services  PT Problem List Decreased strength;Decreased activity tolerance;Decreased balance;Decreased mobility       PT Treatment Interventions DME instruction;Balance training;Functional mobility training;Therapeutic activities;Therapeutic exercise;Manual techniques;Wheelchair mobility training;Patient/family education;Neuromuscular re-education    PT Goals (Current goals can be found in the Care Plan section)  Acute Rehab PT Goals Patient Stated Goal: get better PT Goal Formulation: With patient/family Time For Goal Achievement: 10/29/22 Potential to Achieve Goals: Good    Frequency Min 1X/week     Co-evaluation               AM-PAC PT "6 Clicks" Mobility  Outcome Measure Help needed turning from your back to your side while in a flat bed without using bedrails?: None Help needed moving from lying on your back to sitting on the side of a flat bed without using bedrails?: A Little Help needed moving to and from a bed to a chair (including a wheelchair)?: A Lot Help needed standing up from a chair using your arms (e.g., wheelchair or bedside chair)?: Total Help needed to walk in hospital room?: Total Help needed climbing 3-5 steps with a railing? : Total 6 Click Score: 12    End of Session   Activity Tolerance: Patient tolerated treatment well Patient left: with family/visitor present (in personal w/c) Nurse Communication: Mobility status PT Visit Diagnosis: Muscle weakness (generalized) (M62.81);Other symptoms and signs  involving the nervous system (R29.898)    Time: 1884-1660 PT Time Calculation (min) (ACUTE ONLY): 16 min   Charges:   PT Evaluation $PT Eval Moderate Complexity: 1 Mod   PT General Charges $$ ACUTE PT VISIT: 1 Visit         Aleda Grana, PT, DPT 10/15/22, 9:50 AM   Sandi Mariscal 10/15/2022, 9:49 AM

## 2022-10-15 NOTE — TOC CM/SW Note (Signed)
Transition of Care [facility]) - Inpatient Brief Assessment   Patient Details  Name: Alexander Mcgrath MRN: [id number] Date of Birth: [birth year]  Transition of Care [facility]) CM/SW Contact:    Tom-Johnson, Hershal Coria, RN Phone Number: 10/15/2022, 10:44 AM   Clinical Narrative:  Patient presented to the ED with worsening Numbness and Generalized Weakness to BLE. Has Hx of Polio and Spinal Tuberculosis. Neurology following. Patient is from home and wheelchair bound.   CIR recommended and screened patient. CM awaiting approval for admit.   CM will continue to follow as patient progresses with care towards discharge.       Transition of Care Asessment: Insurance and Status: Insurance coverage has been reviewed Patient has primary care physician: No Home environment has been reviewed: Yes Prior level of function:: W/c bound Prior/Current Home Services: No current home services Social Determinants of Health Reivew: SDOH reviewed no interventions necessary Readmission risk has been reviewed: Yes Transition of care needs: transition of care needs identified, TOC will continue to follow

## 2022-10-15 NOTE — Progress Notes (Signed)
Pt did VC 1.0, NIF > -40

## 2022-10-15 NOTE — Evaluation (Signed)
Occupational Therapy Evaluation Patient Details Name: Alexander Mcgrath MRN: [id number] DOB: [birth year] Today's Date: 10/15/2022   History of Present Illness Pt is a 37 y/o M admitted on 10/12/22 after presenting with c/o numbness & weakness in BLE that has spread to BUE. Highest suspicion for AIDP variant, although without LP, cannot definitively make the diagnosis. PMH: remote hx of polio & spinal tuberculosis, dextroscoliosis   Clinical Impression   PTA, pt lives alone, typically completely Modified Independent from a wheelchair and works as a Psychologist, occupational. Pt presents now with deficits in strength, sitting balance and sensation. Pt requiring Min A for ADLs bed level/lateral leans d/t deficits. Noted difficulties lifting bottom in prep for transfers d/t weakness. Deferred transfer OOB to pt's w/c without second person to secure w/c as it has no brakes. Provided UE HEP for strengthening and sensation including theraband, squeeze ball and putty.  As pt is typically very independent, may benefit from intensive therapies at DC.      If plan is discharge home, recommend the following: A little help with walking and/or transfers;A little help with bathing/dressing/bathroom;Assistance with cooking/housework    Functional Status Assessment  Patient has had a recent decline in their functional status and demonstrates the ability to make significant improvements in function in a reasonable and predictable amount of time.  Equipment Recommendations  Other (comment) (TBD pending progress)    Recommendations for Other Services Rehab consult     Precautions / Restrictions Precautions Precautions: Fall Restrictions Weight Bearing Restrictions: No      Mobility Bed Mobility Overal bed mobility: Needs Assistance Bed Mobility: Supine to Sit, Sit to Supine     Supine to sit: Supervision, HOB elevated Sit to supine: Supervision        Transfers                   General transfer comment:  noted pt personal wheelchair does not lock and unable to close gap well enough for safe transfer based on pt difficulty pushing through BUE to lift bottom. +2 for safety to ensure chair does not move during next attempt      Balance Overall balance assessment: Needs assistance Sitting-balance support: Feet supported Sitting balance-Leahy Scale: Fair Sitting balance - Comments: LOB w/ dynamic challenges without back support                                   ADL either performed or assessed with clinical judgement   ADL Overall ADL's : Needs assistance/impaired Eating/Feeding: Set up;Bed level   Grooming: Contact guard assist;Sitting   Upper Body Bathing: Minimal assistance;Sitting   Lower Body Bathing: Sitting/lateral leans;Bed level;Minimal assistance   Upper Body Dressing : Minimal assistance;Sitting   Lower Body Dressing: Minimal assistance;Sitting/lateral leans;Sit to/from stand       Toileting- Architect and Hygiene: Minimal assistance;Sitting/lateral lean         General ADL Comments: Limited by significant weakness from baseline in BUE, unable to push UE to lift bottom off of bed as pt typically does multiple times a day for transfers     Vision Ability to See in Adequate Light: 0 Adequate Patient Visual Report: No change from baseline Vision Assessment?: No apparent visual deficits     Perception         Praxis         Pertinent Vitals/Pain Pain Assessment Pain Assessment: No/denies pain     Extremity/Trunk  Assessment Upper Extremity Assessment Upper Extremity Assessment: Generalized weakness;Right hand dominant;RUE deficits/detail;LUE deficits/detail RUE Deficits / Details: tingling/numbness throughout BUE. accurate identification of touch with eyes occluded. overall weak, 3+/5   Lower Extremity Assessment Lower Extremity Assessment: Defer to PT evaluation   Cervical / Trunk Assessment Cervical / Trunk Assessment: Other  exceptions Cervical / Trunk Exceptions: hx of dextroscoliosis   Communication Communication Communication: No apparent difficulties   Cognition Arousal: Alert Behavior During Therapy: WFL for tasks assessed/performed, Flat affect Overall Cognitive Status: Within Functional Limits for tasks assessed                                       General Comments  Father at bedside.    Exercises     Shoulder Instructions      Home Living Family/patient expects to be discharged to:: Private residence Living Arrangements: Alone Available Help at Discharge: Family;Available 24 hours/day Type of Home: House Home Access: Ramped entrance     Home Layout: One level     Bathroom Shower/Tub: Tub/shower unit         Home Equipment: Wheelchair - manual;Tub bench;Other (comment) (vehicle with hand controls)          Prior Functioning/Environment Prior Level of Function : Independent/Modified Independent;Working/employed;Driving             Mobility Comments: Pt is mod I from w/c level, typically "throws self" into w/c that does not have brakes. reports if he falls out of the w/c, he can get himself back into the chair ADLs Comments: works as a Psychologist, occupational, drives (Comptroller), plays adaptive basketball        OT Problem List: Decreased strength;Decreased activity tolerance;Impaired balance (sitting and/or standing);Impaired sensation;Impaired UE functional use      OT Treatment/Interventions: Self-care/ADL training;Therapeutic exercise;Energy conservation;DME and/or AE instruction;Therapeutic activities;Patient/family education    OT Goals(Current goals can be found in the care plan section) Acute Rehab OT Goals Patient Stated Goal: regain strength and independence OT Goal Formulation: With patient Time For Goal Achievement: 10/29/22 Potential to Achieve Goals: Good  OT Frequency: Min 1X/week    Co-evaluation              AM-PAC OT "6 Clicks"  Daily Activity     Outcome Measure Help from another person eating meals?: A Little Help from another person taking care of personal grooming?: A Little Help from another person toileting, which includes using toliet, bedpan, or urinal?: A Little Help from another person bathing (including washing, rinsing, drying)?: A Little Help from another person to put on and taking off regular upper body clothing?: A Little Help from another person to put on and taking off regular lower body clothing?: A Little 6 Click Score: 18   End of Session Equipment Utilized During Treatment: Gait belt  Activity Tolerance: Patient tolerated treatment well Patient left: in bed;with call bell/phone within reach;with family/visitor present  OT Visit Diagnosis: Muscle weakness (generalized) (M62.81)                Time: 1610-9604 OT Time Calculation (min): 24 min Charges:  OT General Charges $OT Visit: 1 Visit OT Evaluation $OT Eval Moderate Complexity: 1 Mod  Bradd Canary, OTR/L Acute Rehab Services Office: [phone]   Lorre Munroe 10/15/2022, 10:57 AM

## 2022-10-16 DIAGNOSIS — R531 Weakness: Secondary | ICD-10-CM | POA: Diagnosis not present

## 2022-10-16 MED ORDER — VITAMIN B-12 1000 MCG PO TABS
1000.0000 ug | ORAL_TABLET | Freq: Every day | ORAL | Status: DC
  Administered 2022-10-16 – 2022-10-19 (×4): 1000 ug via ORAL
  Filled 2022-10-16 (×4): qty 1

## 2022-10-16 MED ORDER — POTASSIUM CHLORIDE 20 MEQ PO PACK
20.0000 meq | PACK | Freq: Two times a day (BID) | ORAL | Status: AC
  Administered 2022-10-16 (×2): 20 meq via ORAL
  Filled 2022-10-16 (×2): qty 1

## 2022-10-16 MED ORDER — CARMEX CLASSIC LIP BALM EX OINT
TOPICAL_OINTMENT | CUTANEOUS | Status: DC | PRN
  Filled 2022-10-16: qty 10

## 2022-10-16 MED ORDER — POLYVINYL ALCOHOL 1.4 % OP SOLN
1.0000 [drp] | OPHTHALMIC | Status: DC | PRN
  Administered 2022-10-16: 1 [drp] via OPHTHALMIC
  Filled 2022-10-16: qty 15

## 2022-10-16 NOTE — Progress Notes (Signed)
HD#4 Subjective:  Overnight Events: No acute event overnight.    Pt was seen bedside this AM. States that he is doing well. Does not feel like his strength or numbness is improving as much.  Objective:  Vital signs in last 24 hours: Vitals:   10/15/22 2030 10/15/22 2100 10/15/22 2330 10/16/22 0744  BP: 117/77 121/72 113/78 123/85  Pulse: 77 80 78 78  Resp: 18 18  18   Temp: 98.3 F (36.8 C) 98.2 F (36.8 C) 98.6 F (37 C) 97.8 F (36.6 C)  TempSrc: Oral Oral Oral Oral  SpO2: 100% 100% 100% 100%  Weight:      Height:       Supplemental O2: Room Air SpO2: 100 %  Physical Exam:   CV: RRR. No m/r/g. No LE edema Pulmonary: Lungs are clear bilaterally.   neuro:  Visual fields are full, PERRL. EOMI. Facial sensation is symmetric, unable to use facial muscles to smile or frown. Motor: 5/5 strength bilateral upper extremities Sensory: Symmetric to light tough and temperature in arms and legs   Filed Weights   10/12/22 1329  Weight: 45.4 kg    Intake/Output Summary (Last 24 hours) at 10/16/2022 0911 Last data filed at 10/15/2022 1900 Gross per 24 hour  Intake 1263.77 ml  Output 680 ml  Net 583.77 ml   Net IO Since Admission: -123.02 mL [10/16/22 0911]  No results for input(s): "GLUCAP" in the last 72 hours.   Pertinent Labs:    Latest Ref Rng & Units 10/16/2022    2:56 AM 10/14/2022   11:43 PM 10/14/2022    8:00 PM  CBC  WBC 4.0 - 10.5 K/uL 4.6  5.4  5.2   Hemoglobin 13.0 - 17.0 g/dL 29.5  28.4  13.2   Hematocrit 39.0 - 52.0 % 34.7  36.6  36.2   Platelets 150 - 400 K/uL 210  226  221        Latest Ref Rng & Units 10/16/2022    2:56 AM 10/14/2022   11:43 PM 10/14/2022    9:05 AM  CMP  Glucose 70 - 99 mg/dL 77  81  440   BUN 6 - 20 mg/dL 6  9  7    Creatinine 0.61 - 1.24 mg/dL 1.02  7.25  3.66   Sodium 135 - 145 mmol/L 137  140  139   Potassium 3.5 - 5.1 mmol/L 3.3  3.8  3.6   Chloride 98 - 111 mmol/L 106  107  108   CO2 22 - 32 mmol/L 25  25  23    Calcium 8.9  - 10.3 mg/dL 8.7  8.7  9.0   Total Protein 6.5 - 8.1 g/dL 6.7     Total Bilirubin 0.3 - 1.2 mg/dL 0.6     Alkaline Phos 38 - 126 U/L 28     AST 15 - 41 U/L 42     ALT 0 - 44 U/L 15       Imaging: No results found.  Assessment/Plan:   Principal Problem:   Weakness Active Problems:   Numbness   Patient Summary: Alexander Mcgrath is a 37 y.o. with a history of polio and spinal TB who presented with LLE numbness/tingling and bilateral upper extremity numbness/tingling that has now progressed to bilateral upper extremity weakness, dysarthria, dysphagia, and facial weakness.  Workup so far unremarkable.  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 Pt reports no improvement or worsening of symptoms. Physical exam has been  consistent since admission, with mild increase in strength in RUE. He does endorse continued numbness and tingling in upper and lower extremities. Today is Day 4 of IVIG treatment, will continue to monitor strength, he is currently being considered for CIR candidacy.     -Neurology following, appreciate recommendations. -Continue IVIG x 5 days through 8/11(#4 of 5). -PT/OT. -Follow up labs in process: porphyrins, -Monitor CBC, CMP.  Anxiety. Stable. -Hydroxyzine 25 mg PRN.  -Monitor.  Neuropathic Pain -Gabapentin 300 mg twice daily.  Diet: Thin Liquid  IVF:  None VTE: SCDs Start: 10/12/22 1719 Code: Full PT/OT:  CIR ID:  Anti-infectives (From admission, onward)    None       Anticipated discharge to Home pending.  Olegario Messier, MD Internal Medicine Resident, PGY-2 [facility]  Pager: [phone] 9:11 AM, 10/16/2022   **Please contact the on call pager after 5 pm and on weekends at [phone].**

## 2022-10-16 NOTE — PMR Pre-admission (Shared)
PMR Admission Coordinator Pre-Admission Assessment  Patient: Alexander Mcgrath is an 37 y.o., male MRN: [id number] DOB: 1986-01-16 Height: 5\' 1"  (154.9 cm) Weight: 45.4 kg  Insurance Information HMO:     PPO: yes     PCP:      IPA:      80/20:      OTHER:  PRIMARY: BCBS      Policy#: [id number]      Subscriber: patient CM Name: ***      Phone#: ***     Fax#: [phone] Pre-Cert#: [id number]      Employer:  Benefits:  Phone #: [phone]     Name:  Eff. Date: 03/08/22-03/07/2198     Deduct: $2,000 ($0 met) Out of Pocket Max: $5,000 ($0 met)      Life Max: NA CIR: 80% coverage, 20% co-insurance      SNF: 80% coverage, 20% co-insurance Outpatient: $50 co-pay/visit     Co-Pay:  Home Health: 80% coverage      Co-Pay: 20% co-insurance DME: 80% coverage     Co-Pay: 20% co-insurance Providers: in-network SECONDARY:       Policy#:      Phone#:   Artist:       Phone#:   The Data processing manager" for patients in Inpatient Rehabilitation Facilities with attached "Privacy Act Statement-Health Care Records" was provided and verbally reviewed with: N/A  Emergency Contact Information Contact Information     Name Relation Home Work Mobile   IVES,ANDI Mother   [phone]      Other Contacts     Name Relation Home Work Mobile   [place] Father   [phone]       Current Medical History  Patient Admitting Diagnosis: AIDP, debility History of Present Illness: Pt is a 37 year old male with medical hx significant for: Polio, Potts disease, kyphosis, spinal TB. Pt presented to [facility] on 10/13/22 d/t concerns for progressively worsening numbness x5 days, speech difficulties, dysphagia and blurry vision. Pt was seen in ED on 10/08/22 for similar symptoms. Workup was negative at that time. Lumbar spine x-ray negative for acute abnormalities. Neurology consulted and recommended MRI brain/cervical. MRI was negative for acute abnormalities. IR  unable to obtain LP. 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 Recommended IVIG daily x5 doses. Therapy evaluations completed and CIR recommended d/t pt's deficits in functional mobility.***    Patient's medical record from [facility] has been reviewed by the rehabilitation admission coordinator and physician.  Past Medical History  Past Medical History:  Diagnosis Date   Polio    TB of vertebral column     Has the patient had major surgery during 100 days prior to admission? No  Family History   family history is not on file.  Current Medications  Current Facility-Administered Medications:    acetaminophen (TYLENOL) tablet 650 mg, 650 mg, Oral, Q6H PRN, [name], MD, 650 mg at 10/15/22 1839   cyanocobalamin (VITAMIN B12) tablet 1,000 mcg, 1,000 mcg, Oral, Daily, Bhagat, Srishti L, MD, 1,000 mcg at 10/18/22 1026   gabapentin (NEURONTIN) capsule 300 mg, 300 mg, Oral, BID, Koomson, Julius, MD, 300 mg at 10/18/22 1026   hydrOXYzine (ATARAX) tablet 25 mg, 25 mg, Oral, TID PRN, Atway, Rayann N, DO, 25 mg at 10/17/22 2152   ibuprofen (ADVIL) tablet 600 mg, 600 mg, Oral, Q6H PRN, Masters, Katie, DO, 600 mg at 10/17/22 2152   lip balm (CARMEX) ointment, , Topical, PRN, Reymundo Poll, MD, Given at 10/16/22 2003  melatonin tablet 5 mg, 5 mg, Oral, QHS, Koomson, Julius, MD, 5 mg at 10/17/22 2152   multivitamin with minerals tablet 1 tablet, 1 tablet, Oral, Daily, [name], MD, 1 tablet at 10/18/22 1026   polyvinyl alcohol (LIQUIFILM TEARS) 1.4 % ophthalmic solution 1 drop, 1 drop, Both Eyes, PRN, Reymundo Poll, MD, 1 drop at 10/16/22 2005  Patients Current Diet:  Diet Order             DIET DYS 2 Room service appropriate? Yes; Fluid consistency: Thin  Diet effective now                   Precautions / Restrictions Precautions Precautions: Fall Restrictions Weight Bearing Restrictions: No   Has the patient had 2 or more falls or a fall with injury in  the past year? Yes (Pt reported he falls at least 1x/month with no resulting injury)  Prior Activity Level Community (5-7x/wk): works, drives, gets out of the house frequently  Prior Functional Level Self Care: Did the patient need help bathing, dressing, using the toilet or eating? Independent  Indoor Mobility: Did the patient need assistance with walking from room to room (with or without device)? NA  Stairs: Did the patient need assistance with internal or external stairs (with or without device)? NA  Functional Cognition: Did the patient need help planning regular tasks such as shopping or remembering to take medications? Independent  Patient Information Are you of Hispanic, Latino/a,or Spanish origin?: A. No, not of Hispanic, Latino/a, or Spanish origin What is your race?: B. Black or African American ([place]) Do you need or want an interpreter to communicate with a doctor or health care staff?: 0. No  Patient's Response To:  Health Literacy and Transportation Is the patient able to respond to health literacy and transportation needs?: Yes Health Literacy - How often do you need to have someone help you when you read instructions, pamphlets, or other written material from your doctor or pharmacy?: Never In the past 12 months, has lack of transportation kept you from medical appointments or from getting medications?: No In the past 12 months, has lack of transportation kept you from meetings, work, or from getting things needed for daily living?: No  Journalist, newspaper / Equipment Home Assistive Devices/Equipment: Wheelchair Home Equipment: Wheelchair - manual, Tub bench, Other (comment) (vehicle with hand controls)  Prior Device Use: Indicate devices/aids used by the patient prior to current illness, exacerbation or injury? Manual wheelchair  Current Functional Level Cognition  Overall Cognitive Status: Within Functional Limits for tasks assessed Orientation Level:  Oriented X4    Extremity Assessment (includes Sensation/Coordination)  Upper Extremity Assessment: Generalized weakness RUE Deficits / Details: some improvements in sensation. accurate identification of touch with eyes occluded. overall weak, 4-/5  Lower Extremity Assessment: Defer to PT evaluation    ADLs  Overall ADL's : Needs assistance/impaired Eating/Feeding: Set up, Bed level Grooming: Contact guard assist, Sitting Upper Body Bathing: Minimal assistance, Sitting Lower Body Bathing: Sitting/lateral leans, Bed level, Minimal assistance Upper Body Dressing : Minimal assistance, Sitting Lower Body Dressing: Minimal assistance, Sitting/lateral leans, Sit to/from stand Toileting- Clothing Manipulation and Hygiene: Minimal assistance, Sitting/lateral lean General ADL Comments: Focus on trial of sliding board for safe transfers, discussion of modification of ADLs w/ pt reporting able to manage bed level with Setup assist this AM. educated on progression of UE HEP, pushing through BUE on bed + sustaining this action to maximize transfer abilities    Mobility  Overal  bed mobility: Modified Independent Bed Mobility: Supine to Sit, Sit to Supine Sidelying to sit: Supervision, HOB elevated Supine to sit: Modified independent (Device/Increase time) Sit to supine: Modified independent (Device/Increase time) General bed mobility comments: uses BUE to move BLE to EOB, no physical assist needed with bed mobility    Transfers  Overall transfer level: Needs assistance Equipment used: Sliding board Transfers: Bed to chair/wheelchair/BSC Bed to/from chair/wheelchair/BSC transfer type:: Lateral/scoot transfer Squat pivot transfers: Mod assist  Lateral/Scoot Transfers: Min assist, +2 safety/equipment, With slide board General transfer comment: Min A  x 2 for sliding board transfer to/from w/c with close hands on assist for balance    Ambulation / Gait / Stairs / Corporate investment banker mobility: Yes Wheelchair propulsion: Both upper extremities Wheelchair parts: Independent Distance: 250 feet Wheelchair Assistance Details (indicate cue type and reason): Pt's lightweight manual w/c does not have brakes.    Posture / Balance Dynamic Sitting Balance Sitting balance - Comments: LEs are not able to provide good support due to paralysis Balance Overall balance assessment: Needs assistance Sitting-balance support: Feet supported, Bilateral upper extremity supported Sitting balance-Leahy Scale: Fair Sitting balance - Comments: LEs are not able to provide good support due to paralysis    Special needs/care consideration NA   Previous Home Environment (from acute therapy documentation) Living Arrangements: Alone Available Help at Discharge: Family, Available 24 hours/day Type of Home: House Home Layout: One level Home Access: Ramped entrance Bathroom Shower/Tub: Engineer, manufacturing systems: Standard Bathroom Accessibility: Yes How Accessible: Accessible via wheelchair Home Care Services: No  Discharge Living Setting Plans for Discharge Living Setting: Patient's home Type of Home at Discharge: House Discharge Home Layout: One level Discharge Home Access: Ramped entrance Discharge Bathroom Shower/Tub: Tub/shower unit Discharge Bathroom Toilet: Standard Discharge Bathroom Accessibility: Yes How Accessible: Accessible via wheelchair Does the patient have any problems obtaining your medications?: No  Social/Family/Support Systems Anticipated Caregiver: Bryent Collman (father) and other family Anticipated Caregiver's Contact Information: [place] Dec: [phone] Caregiver Availability: 24/7 Discharge Plan Discussed with Primary Caregiver: Yes Is Caregiver In Agreement with Plan?: Yes Does Caregiver/Family have Issues with Lodging/Transportation while Pt is in Rehab?: No  Goals Patient/Family Goal for Rehab: Mod I:PT/OT Expected length of  stay: 12-14 days Pt/Family Agrees to Admission and willing to participate: Yes Program Orientation Provided & Reviewed with Pt/Caregiver Including Roles  & Responsibilities: Yes  Decrease burden of Care through IP rehab admission: NA  Possible need for SNF placement upon discharge: Not anticipated  Patient Condition: I have reviewed medical records from [facility], spoken with CM, and patient and family member. I met with patient at the bedside for inpatient rehabilitation assessment.  Patient will benefit from ongoing PT and OT, can actively participate in 3 hours of therapy a day 5 days of the week, and can make measurable gains during the admission.  Patient will also benefit from the coordinated team approach during an Inpatient Acute Rehabilitation admission.  The patient will receive intensive therapy as well as Rehabilitation physician, nursing, social worker, and care management interventions.  Due to safety, disease management, medication administration, pain management, and patient education the patient requires 24 hour a day rehabilitation nursing.  The patient is currently *** with mobility and basic ADLs.  Discharge setting and therapy post discharge at home with home health is anticipated.  Patient has agreed to participate in the Acute Inpatient Rehabilitation Program and will admit {Time; today/tomorrow:10263}.  Preadmission Screen Completed By:  Virgilio Belling  Rubye Oaks, 10/18/2022 2:40 PM ______________________________________________________________________   Discussed status with Dr. Marland Kitchen on *** at *** and received approval for admission today.  Admission Coordinator:  Domingo Pulse, CCC-SLP, time ***/Date ***   Assessment/Plan: Diagnosis: Does the need for close, 24 hr/day Medical supervision in concert with the patient's rehab needs make it unreasonable for this patient to be served in a less intensive setting? {yes_no_potentially:3041433} Co-Morbidities  requiring supervision/potential complications: *** Due to {due ZO:1096045}, does the patient require 24 hr/day rehab nursing? {yes_no_potentially:3041433} Does the patient require coordinated care of a physician, rehab nurse, PT, OT, and SLP to address physical and functional deficits in the context of the above medical diagnosis(es)? {yes_no_potentially:3041433} Addressing deficits in the following areas: {deficits:3041436} Can the patient actively participate in an intensive therapy program of at least 3 hrs of therapy 5 days a week? {yes_no_potentially:3041433} The potential for patient to make measurable gains while on inpatient rehab is {potential:3041437} Anticipated functional outcomes upon discharge from inpatient rehab: {functional outcomes:304600100} PT, {functional outcomes:304600100} OT, {functional outcomes:304600100} SLP Estimated rehab length of stay to reach the above functional goals is: *** Anticipated discharge destination: {anticipated dc setting:21604} 10. Overall Rehab/Functional Prognosis: {potential:3041437}   MD Signature: ***

## 2022-10-16 NOTE — Progress Notes (Signed)
VC = 1.4 liters NIF greater than -40 cmH20  Excellent effort by patient.

## 2022-10-16 NOTE — Progress Notes (Signed)
Inpatient Rehab Admissions:  Inpatient Rehab Consult received.  I met with patient and father at the bedside for rehabilitation assessment and to discuss goals and expectations of an inpatient rehab admission.  Discussed average length of stay, insurance authorization requirement, discharge home after completion of CIR. Both acknowledged  understanding. Pt interested in pursuing CIR. Father supportive. Father confirmed that he and other family will be able to provide 24/7 support for pt after discharge. Will continue to follow.  Signed: Wolfgang Phoenix, MS, CCC-SLP Admissions Coordinator [phone]

## 2022-10-16 NOTE — Plan of Care (Signed)

## 2022-10-16 NOTE — Progress Notes (Signed)
Neurology Progress Note  Brief HPI: Alexander Mcgrath is a 37 y.o. male with remote hx of polio (came to [place] around age 27) and spinal TB presenting with 5 days of numbness in left leg that has now spread to his bilateral upper extremities. Symptoms have progressed to bilateral upper extremity weakness, as well as speech difficulties and dysphagia, which is new since 8/2. He is unable to use his facial muscles and is not able to smile or frown. Denies any recent illnesses or any recent vaccines.   Subjective: The patient was seen at the bedside with no family present. He is tolerating IVIG well. He states that he feels about the same as yesterday with maybe some improvement, but still is having significant neuropathic pain in his arms/fingers. Denies nausea, reports mild headache.   Exam: Vitals:   10/15/22 2330 10/16/22 0744  BP: 113/78 123/85  Pulse: 78 78  Resp:  18  Temp: 98.6 F (37 C) 97.8 F (36.6 C)  SpO2: 100% 100%   Gen: In bed, NAD Resp: non-labored breathing, no acute distress Abd: soft, nt  Neuro: Mental Status: awake, alert, oriented to person, place, time, and situation. Able to give clear and coherent history, but speech is slightly dysarthric.  Cranial Nerves: Visual fields are full, PERRL. EOMI. Facial sensation is symmetric, unable to fully smile or frown, but slightly improved Motor: 5/5 strength bilateral upper extremities Sensory: Symmetric to light tough and temperature in arms and legs Gait: Deferred  Pertinent Labs: B12, folate, TSH: WNL B6: WNL Mg, Phos, BMP: unremarkable  Porphyrins CMV - negative Coxsackie Component     Latest Ref Rng 10/12/2022  COXSACKIE B1 AB     Neg:<1:100  1:100 (H)   COXSACKIE B2 AB     Neg:<1:100  1:100 (H)   COXSACKIE B3 AB     Neg:<1:100  1:100 (H)   COXSACKIE B4 AB     Neg:<1:100  1:500 (H)   COXSACKIE B5 AB     Neg:<1:100  1:1000 (H)   COXSACKIE B6 AB     Neg:<1:100  1:500 (H)    Component     Latest Ref  Rng 10/12/2022  Coxsackie A7 IgG     Neg:<1:100 titer 1:800 (H)   Coxsackie A9 IgG     Neg:<1:100 titer 1:800 (H)   Coxsackie A16 IgG     Neg:<1:100 titer 1:800 (H)   Coxsackie A24 IgG     Neg:<1:100 titer 1:800 (H)   Coxsackie A7 IgM     Neg:<1:10 titer Negative   Coxsackie A9 IgM     Neg:<1:10 titer Negative   Coxsackie A16 IgM     Neg:<1:10 titer Negative   Coxsackie A24 IgM     Neg:<1:10 titer Negative     Legend: (H) High Heavy metals - negative   CSF Labs- Pending  Lyme DNA Manganese Levels West Nile SPEP  Imaging Reviewed: MRI cervical spine: 1. Mild multilevel degenerative disc and joint changes as above. 2. Borderline mild left C3-4 neuroforaminal stenosis. 3. No central canal stenosis.  MRI Brain: Normal brain MRI  Assessment: 53M with significant baseline paralysis and atrophy of BL lower extremities from polio at the age of 33 and Potts disease. Presenting with rapid ascending paralysis with numbness that has developed over the last few days. He has mild upper extremity weakness, but sensation and coordination is intact. Sxs have also progressed to involving cranial nerves, with slurred speech, facial diplegia and difficulty chewing and swallowing.  Differential includes  nutritional, autoimmune and infectious etiologies. B12/folate deficiency ruled out with normal labs. HIV Ab non-reactive. 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 Highest suspicion for AIDP variant, although without LP, cannot definitively make the diagnosis. Discussed risks and benefits of IVIG and PLEX with the patient and he opted to proceed with IVIG. First dose on 8/7.   Recommendations: - Continue IVIG x 5 days through 8/11 - Utilize PRN tylenol for headache - PT/OT  - B12 1000 mcg daily for goal > 400  - Will plan to clarify tuberculosis treatment history - Neurology will continue to follow  Patient seen and examined by NP/APP with MD. MD to update note as needed.    Elmer Picker, DNP, FNP-BC [facility] Pager: [phone]  Attending Neurologist's note:  Seen by nurse practitioner, discussed with myself.  Billing to be completed per NP

## 2022-10-17 DIAGNOSIS — R531 Weakness: Secondary | ICD-10-CM | POA: Diagnosis not present

## 2022-10-17 LAB — BASIC METABOLIC PANEL
Anion gap: 9 (ref 5–15)
BUN: 7 mg/dL (ref 6–20)
CO2: 23 mmol/L (ref 22–32)
Calcium: 9 mg/dL (ref 8.9–10.3)
Chloride: 104 mmol/L (ref 98–111)
Creatinine, Ser: 0.43 mg/dL — ABNORMAL LOW (ref 0.61–1.24)
GFR, Estimated: >60 mL/min (ref >60)
Glucose, Bld: 79 mg/dL (ref 70–99)
Potassium: 3.7 mmol/L (ref 3.5–5.1)
Sodium: 136 mmol/L (ref 135–145)

## 2022-10-17 LAB — CBC
HCT: 36.6 % — ABNORMAL LOW (ref 39.0–52.0)
Hemoglobin: 12 g/dL — ABNORMAL LOW (ref 13.0–17.0)
MCH: 29.4 pg (ref 26.0–34.0)
MCHC: 32.8 g/dL (ref 30.0–36.0)
MCV: 89.7 fL (ref 80.0–100.0)
Platelets: 207 10*3/uL (ref 150–400)
RBC: 4.08 MIL/uL — ABNORMAL LOW (ref 4.22–5.81)
RDW: 12.6 % (ref 11.5–15.5)
WBC: 3.9 10*3/uL — ABNORMAL LOW (ref 4.0–10.5)
nRBC: 0 % (ref 0.0–0.2)

## 2022-10-17 NOTE — Progress Notes (Addendum)
HD#5 Subjective:  Overnight Events: No acute event overnight.    Pt was seen bedside this AM. He states that he is doing well, and feels like his strength is getting slightly better.    Objective:  Vital signs in last 24 hours: Vitals:   10/16/22 2100 10/16/22 2347 10/17/22 0352 10/17/22 0800  BP: 113/78 113/70 (!) 114/90 125/80  Pulse: 80 83 69 86  Resp: 18 17 16 17   Temp: 98.2 F (36.8 C) 98.3 F (36.8 C) 97.8 F (36.6 C) 98 F (36.7 C)  TempSrc: Oral Oral Oral Oral  SpO2:  100% 100% 100%  Weight:      Height:       Supplemental O2: Room Air SpO2: 100 %  Physical Exam:   CV: RRR. No m/r/g. No LE edema Pulmonary: Lungs are clear bilaterally.   Neurological: Facial sensation is symmetric, facial muscle use improving on right side when asked to smile, able to raise eyebrows more than yesterday.  Motor: 5/5 strength bilateral upper extremities Sensory: L>R Sensation in upper extremities, normal sensation in lower extremities   Filed Weights   10/12/22 1329  Weight: 45.4 kg    Intake/Output Summary (Last 24 hours) at 10/17/2022 0923 Last data filed at 10/17/2022 0000 Gross per 24 hour  Intake --  Output 1050 ml  Net -1050 ml   Net IO Since Admission: -1,173.02 mL [10/17/22 0923]  No results for input(s): "GLUCAP" in the last 72 hours.   Pertinent Labs:    Latest Ref Rng & Units 10/17/2022    7:14 AM 10/16/2022    2:56 AM 10/14/2022   11:43 PM  CBC  WBC 4.0 - 10.5 K/uL 3.9  4.6  5.4   Hemoglobin 13.0 - 17.0 g/dL 16.1  09.6  04.5   Hematocrit 39.0 - 52.0 % 36.6  34.7  36.6   Platelets 150 - 400 K/uL 207  210  226        Latest Ref Rng & Units 10/17/2022    7:14 AM 10/16/2022    2:56 AM 10/14/2022   11:43 PM  CMP  Glucose 70 - 99 mg/dL 79  77  81   BUN 6 - 20 mg/dL 7  6  9    Creatinine 0.61 - 1.24 mg/dL 4.09  8.11  9.14   Sodium 135 - 145 mmol/L 136  137  140   Potassium 3.5 - 5.1 mmol/L 3.7  3.3  3.8   Chloride 98 - 111 mmol/L 104  106  107   CO2  22 - 32 mmol/L 23  25  25    Calcium 8.9 - 10.3 mg/dL 9.0  8.7  8.7   Total Protein 6.5 - 8.1 g/dL  6.7    Total Bilirubin 0.3 - 1.2 mg/dL  0.6    Alkaline Phos 38 - 126 U/L  28    AST 15 - 41 U/L  42    ALT 0 - 44 U/L  15      Imaging: No results found.  Assessment/Plan:   Principal Problem:   Weakness Active Problems:   Numbness   Patient Summary: Alexander Mcgrath is a 37 y.o. with a history of polio and spinal TB who presented with LLE numbness/tingling and bilateral upper extremity numbness/tingling that has now progressed to bilateral upper extremity weakness, dysarthria, dysphagia, and facial weakness.  Workup so far unremarkable.  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 Pt reports no improvement or worsening of symptoms. Physical  exam has slightly improved, with increased control of facial muscles as well as better strength in his upper extremities. Numbness and tingling has not improved as of yet. Today is day 5/5 of IVIG therapy, will continue to monitor strength, he is currently being considered for CIR candidacy.     -Neurology following, appreciate recommendations. -Continue IVIG x 5 days through 8/11(#5 of 5). -PT/OT. -Follow up labs in process: porphyrins, -Monitor CBC, CMP.  Anxiety. Stable. -Hydroxyzine 25 mg PRN.  -Monitor.  Neuropathic Pain -Gabapentin 300 mg twice daily.  Diet: Thin Liquid  IVF:  None VTE: SCDs Start: 10/12/22 1719 Code: Full PT/OT:  CIR ID:  Anti-infectives (From admission, onward)    None       Anticipated discharge to CIR for strengthening  Olegario Messier, MD Internal Medicine Resident, PGY-2 [facility]  Pager: [phone] 9:23 AM, 10/17/2022   **Please contact the on call pager after 5 pm and on weekends at [phone].**

## 2022-10-17 NOTE — Progress Notes (Signed)
VC 1.2 L NIF greater than -40 Good Patient Effort.

## 2022-10-17 NOTE — Plan of Care (Signed)

## 2022-10-17 NOTE — Progress Notes (Addendum)
Neurology Progress Note  Brief HPI: Alexander Mcgrath is a 37 y.o. male with remote hx of polio (came to [place] around age 58) and spinal TB presenting with 5 days of numbness in left leg that has now spread to his bilateral upper extremities. Symptoms have progressed to bilateral upper extremity weakness, as well as speech difficulties and dysphagia, which is new since 8/2. He is unable to use his facial muscles and is not able to smile or frown. Denies any recent illnesses or any recent vaccines.   Subjective: The patient was seen at the bedside with no family present. He is tolerating IVIG well, last dose is scheduled for today He states that he feels about the same as yesterday with maybe some improvement, but still is having significant neuropathic pain in his arms/fingers. He is using the therapy putty, stress ball, and bands. Denies nausea, reports mild headache. Interested in establishing with outpatient neurology here in [place].   Exam: Vitals:   10/16/22 2100 10/17/22 0352  BP: 113/78 (!) 114/90  Pulse: 80 69  Resp: 18 16  Temp: 98.2 F (36.8 C) 97.8 F (36.6 C)  SpO2:  100%   Gen: In bed, NAD Resp: non-labored breathing, no acute distress Abd: soft, nt  Neuro: Mental Status: awake, alert, oriented to person, place, time, and situation. Able to give clear and coherent history, but speech is slightly dysarthric.  Cranial Nerves: Visual fields are full, PERRL. EOMI. Facial sensation is symmetric, continues to have facial diplegia worse in the lower than upper face.  Trace smile.  Right eyebrow elevates more than the left (only trace left eyebrow elevation).  Incomplete eye closure left worse than right Motor: 4/5 deltoids, 3 to 4-/5 elbow extension, 3 to 4-/5 elbow flexion, 5/5 wrist extension, 4/5 wrist flexion (this has been previously documented as 5/5 in prior neuro notes but attending attestations have noted some upper extremity weakness) Sensory: Impaired sensation in  the right upper extremity more than left upper extremity.  Sensory symptoms have been stable per patient Reflexes: Absent biceps/triceps/brachioradialis Gait: Deferred nonambulatory at baseline  Pertinent Labs: B12, folate, TSH: WNL B6: WNL Mg, Phos, BMP: unremarkable  Porphyrins CMV - negative Coxsackie Component     Latest Ref Rng 10/12/2022  COXSACKIE B1 AB     Neg:<1:100  1:100 (H)   COXSACKIE B2 AB     Neg:<1:100  1:100 (H)   COXSACKIE B3 AB     Neg:<1:100  1:100 (H)   COXSACKIE B4 AB     Neg:<1:100  1:500 (H)   COXSACKIE B5 AB     Neg:<1:100  1:1000 (H)   COXSACKIE B6 AB     Neg:<1:100  1:500 (H)    Component     Latest Ref Rng 10/12/2022  Coxsackie A7 IgG     Neg:<1:100 titer 1:800 (H)   Coxsackie A9 IgG     Neg:<1:100 titer 1:800 (H)   Coxsackie A16 IgG     Neg:<1:100 titer 1:800 (H)   Coxsackie A24 IgG     Neg:<1:100 titer 1:800 (H)   Coxsackie A7 IgM     Neg:<1:10 titer Negative   Coxsackie A9 IgM     Neg:<1:10 titer Negative   Coxsackie A16 IgM     Neg:<1:10 titer Negative   Coxsackie A24 IgM     Neg:<1:10 titer Negative     Legend: (H) High Heavy metals - negative   CSF Labs- Pending  Lyme DNA Manganese Levels West Nile SPEP  Imaging Reviewed:  MRI cervical spine: 1. Mild multilevel degenerative disc and joint changes as above. 2. Borderline mild left C3-4 neuroforaminal stenosis. 3. No central canal stenosis.  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 Presenting with rapid ascending paralysis with numbness that has developed over the last few days. He has mild upper extremity weakness, but sensation and coordination is intact. Sxs have also progressed to involving cranial nerves, with slurred speech, facial diplegia and difficulty chewing and swallowing.  Differential includes nutritional, autoimmune and infectious  etiologies. B12/folate deficiency ruled out with normal labs. HIV Ab non-reactive. 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 Highest suspicion for AIDP variant, although without LP, cannot definitively make the diagnosis. Discussed risks and benefits of IVIG and PLEX with the patient and he opted to proceed with IVIG, which has been completed.  His strength is improving though still far from baseline, expect he will benefit from planned discharge to acute inpatient rehab.   Recommendations: - Completed IVIG x 5 days today 8/11 - Utilize PRN tylenol for headache - PT/OT  - Continue B12 1000 mcg daily for goal > 400  - Eye ointment at night and moisture chambers to prevent corneal abrasions given facial diplegia - Outpatient follow-up with Dr. Nita Sickle at [facility] neurology placed, and this note will be routed to her to facilitate close follow-up  Patient seen and examined by NP/APP with MD. MD to update note as needed.   Elmer Picker, DNP, FNP-BC [facility] Pager: [phone]  Attending Neurologist's note:  I personally saw this patient, gathering history, performing a full neurologic examination, reviewing relevant labs, personally reviewing relevant imaging including MRI brain, and formulated the assessment and plan, adding the note above for completeness and clarity to accurately reflect my thoughts

## 2022-10-18 DIAGNOSIS — G610 Guillain-Barre syndrome: Secondary | ICD-10-CM | POA: Diagnosis not present

## 2022-10-18 DIAGNOSIS — R531 Weakness: Secondary | ICD-10-CM | POA: Diagnosis not present

## 2022-10-18 MED ORDER — POLYVINYL ALCOHOL 1.4 % OP SOLN: 1.0000 [drp] | OPHTHALMIC | 0 refills | Status: DC | PRN

## 2022-10-18 MED ORDER — CYANOCOBALAMIN 1000 MCG PO TABS: 1000.0000 ug | ORAL_TABLET | Freq: Every day | ORAL | 0 refills | Status: DC

## 2022-10-18 MED ORDER — GABAPENTIN 300 MG PO CAPS: 300.0000 mg | ORAL_CAPSULE | Freq: Two times a day (BID) | ORAL | 0 refills | Status: DC

## 2022-10-18 MED ORDER — CARMEX CLASSIC LIP BALM EX OINT: TOPICAL_OINTMENT | CUTANEOUS | Status: DC | PRN

## 2022-10-18 NOTE — TOC Progression Note (Signed)
Transition of Care [facility]) - Progression Note    Patient Details  Name: Alexander Mcgrath MRN: [id number] Date of Birth: 09/18/1985  Transition of Care [facility]) CM/SW Contact  Kermit Balo, RN Phone Number: 10/18/2022, 1:21 PM  Clinical Narrative:    Awaiting insurance approval for CIR. TOC following.   Expected Discharge Plan: IP Rehab Facility    Expected Discharge Plan and Services                                               Social Determinants of Health (SDOH) Interventions SDOH Screenings   Food Insecurity: No Food Insecurity (10/12/2022)  Housing: Low Risk  (10/12/2022)  Transportation Needs: No Transportation Needs (10/12/2022)  Utilities: Not At Risk (10/12/2022)  Tobacco Use: Low Risk  (10/12/2022)    Readmission Risk Interventions     No data to display

## 2022-10-18 NOTE — Progress Notes (Signed)
Physical Therapy Treatment Patient Details Name: Alexander Mcgrath MRN: [id number] DOB: 04/18/1985 Today's Date: 10/18/2022   History of Present Illness Pt is a 37 y/o M admitted on 10/12/22 after presenting with c/o numbness & weakness in BLE that has spread to BUE. Highest suspicion for AIDP variant, although without LP, cannot definitively make the diagnosis. PMH: remote hx of polio & spinal tuberculosis, dextroscoliosis    PT Comments  Patient received in bed, he is agreeable to PT session. Patient is able to scoot in bed independently. Requires mod A +2 for lateral scoot transfer from bed>< wheelchair. He is unable to raise bottom effectively to clear wheelchair without assist. Patient is motivated to return to mod I transfers. He has no difficulties with wheelchair mobility in halls. Patient will continue to benefit from skilled PT to improve transfers and return to prior functional level.      If plan is discharge home, recommend the following: A lot of help with walking and/or transfers;A little help with bathing/dressing/bathroom   Can travel by private vehicle        Equipment Recommendations  None recommended by PT    Recommendations for Other Services Rehab consult     Precautions / Restrictions Precautions Precautions: Fall Restrictions Weight Bearing Restrictions: No     Mobility  Bed Mobility Overal bed mobility: Modified Independent       Supine to sit: Modified independent (Device/Increase time) Sit to supine: Modified independent (Device/Increase time)   General bed mobility comments: uses BUE to move BLE to EOB, no physical assist needed with bed mobility    Transfers Overall transfer level: Needs assistance Equipment used: None Transfers: Bed to chair/wheelchair/BSC            Lateral/Scoot Transfers: Mod assist, +2 physical assistance General transfer comment: +2 mod assist to scoot laterally from bed to wheelchair and back to bed     Ambulation/Gait                   Dance movement psychotherapist Wheelchair mobility: Yes Wheelchair propulsion: Both upper extremities Wheelchair parts: Independent Distance: 250 feet   Tilt Bed    Modified Rankin (Stroke Patients Only)       Balance Overall balance assessment: Needs assistance Sitting-balance support: Feet supported, Bilateral upper extremity supported Sitting balance-Leahy Scale: Fair Sitting balance - Comments: LEs are not able to provide good support due to paralysis                                    Cognition Arousal: Alert Behavior During Therapy: WFL for tasks assessed/performed Overall Cognitive Status: Within Functional Limits for tasks assessed                                          Exercises Other Exercises Other Exercises: encouraged patient to work on scooting in bed and doing seated bed/chair push ups to improve B UE strength    General Comments        Pertinent Vitals/Pain Pain Assessment Pain Assessment: No/denies pain Pain Intervention(s): Monitored during session, Repositioned    Home Living  Prior Function            PT Goals (current goals can now be found in the care plan section) Acute Rehab PT Goals Patient Stated Goal: improve UE strength PT Goal Formulation: With patient Time For Goal Achievement: 10/29/22 Potential to Achieve Goals: Good Progress towards PT goals: Progressing toward goals    Frequency    Min 1X/week      PT Plan      Co-evaluation              AM-PAC PT "6 Clicks" Mobility   Outcome Measure  Help needed turning from your back to your side while in a flat bed without using bedrails?: None Help needed moving from lying on your back to sitting on the side of a flat bed without using bedrails?: None Help needed moving to and from a bed to a chair (including a  wheelchair)?: A Lot Help needed standing up from a chair using your arms (e.g., wheelchair or bedside chair)?: Total Help needed to walk in hospital room?: Total Help needed climbing 3-5 steps with a railing? : Total 6 Click Score: 13    End of Session   Activity Tolerance: Patient tolerated treatment well Patient left: in bed;with family/visitor present;with call bell/phone within reach Nurse Communication: Mobility status PT Visit Diagnosis: Muscle weakness (generalized) (M62.81);Other symptoms and signs involving the nervous system (R29.898);Other abnormalities of gait and mobility (R26.89)     Time: 1610-9604 PT Time Calculation (min) (ACUTE ONLY): 14 min  Charges:    $Therapeutic Activity: 8-22 mins PT General Charges $$ ACUTE PT VISIT: 1 Visit                     Lamine Laton, PT, GCS 10/18/22,9:59 AM

## 2022-10-18 NOTE — Progress Notes (Signed)
Speech Language Pathology Treatment: Dysphagia  Patient Details Name: Alexander Mcgrath MRN: [id number] DOB: 1985-09-03 Today's Date: 10/18/2022 Time: 0820-0828 SLP Time Calculation (min) (ACUTE ONLY): 8 min  Assessment / Plan / Recommendation Clinical Impression  Pt reports UE improvement, still not able to pucker, smile or make a strong labial seal. Despite this he is able to feed himself soft eggs and masticate well. His tongue is not impacted and nor is his pharynx. He has managed to compensate for labial weakness consistently. Recommend pt continue current diet while admitted; pt is independent but SLP reminded him of strategies and oral care. Can f/u with SLP at next level of care for dysarthria as appropriate; exercises not recommended acutely as fatigue puts pt at risk for worsened function. May be appropriate subacutely. Recommend AIR.   HPI HPI: Alexander Mcgrath is a 37 y.o. male with remote hx of polio (came to [place] around age 51) and spinal TB presenting with 5 days of numbness in left leg that has now spread to his bilateral upper extremities. Symptoms have progressed to bilateral upper extremity weakness, as well as speech difficulties and dysphagia, which is new since 8/2. He is unable to use his facial muscles and is not able to smile or frown. Denies any recent illnesses or any recent vaccines.      SLP Plan  Continue with current plan of care      Recommendations for follow up therapy are one component of a multi-disciplinary discharge planning process, led by the attending physician.  Recommendations may be updated based on patient status, additional functional criteria and insurance authorization.    Recommendations  Diet recommendations: Dysphagia 2 (fine chop);Thin liquid Liquids provided via: Straw Medication Administration: Crushed with puree Supervision: Patient able to self feed Compensations: Slow rate;Small sips/bites;Lingual sweep for clearance of  pocketing;Follow solids with liquid Postural Changes and/or Swallow Maneuvers: Seated upright 90 degrees                        Dysphagia, oral phase (R13.11)     Continue with current plan of care     Tamikka Pilger, Riley Nearing  10/18/2022, 9:48 AM

## 2022-10-18 NOTE — Plan of Care (Signed)
Neurology plan of care  Please see last neuro progress note from Dr. Iver Nestle 8/11. Referral placed for o/p f/u with Dr. Nita Sickle at [facility] and likely o/p EMG/NCS at that time. Note routed to Dr. Allena Katz to facilitate close o/p f/u. No further inpatient neurologic workup or tx recommended at this time. Neurology to be available prn for questions.  Bing Neighbors, MD [facility] [phone]  If 7pm- 7am, please page neurology on call as listed in [facility]ION.

## 2022-10-18 NOTE — Progress Notes (Signed)
Inpatient Rehab Admissions Coordinator:  Saw pt and family at bedside. Informed them that began insurance authorization. Will continue to follow.   Wolfgang Phoenix, MS, CCC-SLP Admissions Coordinator [phone]

## 2022-10-18 NOTE — Progress Notes (Signed)
HD#6 Subjective:  Overnight Events: No acute event overnight.    He reports improvement in numbness and tingling sensation, and upper extremity strength is getting better. He had questions about how to prevent this from happening in the future.  Objective:  Vital signs in last 24 hours: Vitals:   10/17/22 2014 10/18/22 0004 10/18/22 0352 10/18/22 0957  BP: 126/80 (!) 104/59 114/67 119/83  Pulse: 70 74 60 (!) 108  Resp: 18   19  Temp: 98.6 F (37 C) 99 F (37.2 C) 97.7 F (36.5 C) 97.8 F (36.6 C)  TempSrc: Oral Oral Oral Oral  SpO2: 100% 100% 100% 100%  Weight:      Height:       Supplemental O2: Room Air SpO2: 100 %  Physical Exam:   CV: RRR. No m/r/g. No LE edema Pulmonary: Lungs are clear bilaterally.   Neurological: Facial sensation is symmetric, facial muscle use improving on right side when asked to smile, able to raise eyebrows more than yesterday.  Motor: 5/5 strength bilateral upper extremities Sensory: L>R Sensation in upper extremities, normal sensation in lower extremities   Filed Weights   10/12/22 1329  Weight: 45.4 kg    Intake/Output Summary (Last 24 hours) at 10/18/2022 1041 Last data filed at 10/17/2022 2016 Gross per 24 hour  Intake --  Output 750 ml  Net -750 ml   Net IO Since Admission: -1,443.02 mL [10/18/22 1041]  No results for input(s): "GLUCAP" in the last 72 hours.   Pertinent Labs:    Latest Ref Rng & Units 10/18/2022    1:49 AM 10/17/2022    7:14 AM 10/16/2022    2:56 AM  CBC  WBC 4.0 - 10.5 K/uL 5.4  3.9  4.6   Hemoglobin 13.0 - 17.0 g/dL 45.4  09.8  11.9   Hematocrit 39.0 - 52.0 % 35.9  36.6  34.7   Platelets 150 - 400 K/uL 225  207  210        Latest Ref Rng & Units 10/18/2022    1:49 AM 10/17/2022    7:14 AM 10/16/2022    2:56 AM  CMP  Glucose 70 - 99 mg/dL 82  79  77   BUN 6 - 20 mg/dL 7  7  6    Creatinine 0.61 - 1.24 mg/dL 1.47  8.29  5.62   Sodium 135 - 145 mmol/L 138  136  137   Potassium 3.5 - 5.1 mmol/L  4.0  3.7  3.3   Chloride 98 - 111 mmol/L 105  104  106   CO2 22 - 32 mmol/L 26  23  25    Calcium 8.9 - 10.3 mg/dL 9.2  9.0  8.7   Total Protein 6.5 - 8.1 g/dL 7.9   6.7   Total Bilirubin 0.3 - 1.2 mg/dL 0.7   0.6   Alkaline Phos 38 - 126 U/L 31   28   AST 15 - 41 U/L 40   42   ALT 0 - 44 U/L 15   15     Imaging: No results found.  Assessment/Plan:   Principal Problem:   Weakness Active Problems:   Numbness   Patient Summary: Alexander Mcgrath is a 37 y.o. with a history of polio and spinal TB who presented with LLE numbness/tingling and bilateral upper extremity numbness/tingling that has now progressed to bilateral upper extremity weakness, dysarthria, dysphagia, and facial weakness.  Workup so far unremarkable.  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  He reports slight improvement in upper extremity strength, and upper extremity paresthesias.  He is S/p 5 doses of IVIG. On Physical exam today, there is an improvement in upper extremity strength. He is currently being considered for CIR candidacy.     -Neurology following, appreciate recommendations. -PT/OT. -Outpatient follow-up with Dr. Nita Sickle. -Continue B12 1000 mcg daily for goal > 400  -Eye ointment at night and moisture chambers to prevent corneal abrasions given facial diplegia. -Monitor CBC, CMP.  Anxiety. Stable. -Hydroxyzine 25 mg PRN.  -Monitor.  Neuropathic Pain -Gabapentin 300 mg twice daily.  Diet: Thin Liquid  IVF:  None VTE: SCDs Start: 10/12/22 1719 Code: Full PT/OT:  CIR ID:  Anti-infectives (From admission, onward)    None       Anticipated discharge to CIR for strengthening  Laretta Bolster, M.D.  Internal Medicine Resident, PGY-1 [facility]  Pager: [phone] 10:41 AM, 10/18/2022   **Please contact the on call pager after 5 pm and on weekends at [phone].**  10:41 AM, 10/18/2022

## 2022-10-18 NOTE — Discharge Instructions (Signed)
It was a pleasure taking care of you!  You admitted due to weakness in your upper extremity, in addition to tingling and numbness.  -Please continue vitamin B12 1000 mcg.  -Continue to use eye on splints at night.  -Outpatient follow-up with Dr. Nita Sickle.

## 2022-10-18 NOTE — Progress Notes (Signed)
Occupational Therapy Treatment Patient Details Name: Alexander Mcgrath MRN: [id number] DOB: Nov 17, 1985 Today's Date: 10/18/2022   History of present illness Pt is a 37 y/o M admitted on 10/12/22 after presenting with c/o numbness & weakness in BLE that has spread to BUE. Highest suspicion for AIDP variant, although without LP, cannot definitively make the diagnosis. PMH: remote hx of polio & spinal tuberculosis, dextroscoliosis   OT comments  Pt progressing gradually towards OT goals w/ some improvements in strength though still requiring physical assist for transfers. Trial of sliding board with pt able to manage with Min A x 2 (+2 for securing personal w/c w/o locks) though reports not confident with sitting balance required using this method. Emphasis on UE HEP to further address weakness w/ ideas to further build upon activities.       If plan is discharge home, recommend the following:  A little help with walking and/or transfers;A little help with bathing/dressing/bathroom;Assistance with cooking/housework   Equipment Recommendations  None recommended by OT    Recommendations for Other Services Rehab consult    Precautions / Restrictions Precautions Precautions: Fall Restrictions Weight Bearing Restrictions: No       Mobility Bed Mobility Overal bed mobility: Modified Independent                  Transfers Overall transfer level: Needs assistance Equipment used: Sliding board Transfers: Bed to chair/wheelchair/BSC            Lateral/Scoot Transfers: Min assist, +2 safety/equipment, With slide board General transfer comment: Min A  x 2 for sliding board transfer to/from w/c with close hands on assist for balance     Balance Overall balance assessment: Needs assistance Sitting-balance support: Feet supported, Bilateral upper extremity supported Sitting balance-Leahy Scale: Fair                                     ADL either performed or  assessed with clinical judgement   ADL Overall ADL's : Needs assistance/impaired                                       General ADL Comments: Focus on trial of sliding board for safe transfers, discussion of modification of ADLs w/ pt reporting able to manage bed level with Setup assist this AM. educated on progression of UE HEP, pushing through BUE on bed + sustaining this action to maximize transfer abilities    Extremity/Trunk Assessment Upper Extremity Assessment Upper Extremity Assessment: Generalized weakness RUE Deficits / Details: some improvements in sensation. accurate identification of touch with eyes occluded. overall weak, 4-/5   Lower Extremity Assessment Lower Extremity Assessment: Defer to PT evaluation        Vision   Vision Assessment?: No apparent visual deficits   Perception     Praxis      Cognition Arousal: Alert Behavior During Therapy: WFL for tasks assessed/performed, Flat affect Overall Cognitive Status: Within Functional Limits for tasks assessed                                          Exercises Exercises: Other exercises Other Exercises Other Exercises: BUE push up bed level (able to hold for 55 sec) Other Exercises: pincer  pinch on putty    Shoulder Instructions       General Comments Pt's dad at bedside    Pertinent Vitals/ Pain       Pain Assessment Pain Assessment: No/denies pain Pain Intervention(s): Monitored during session  Home Living                                          Prior Functioning/Environment              Frequency  Min 1X/week        Progress Toward Goals  OT Goals(current goals can now be found in the care plan section)  Progress towards OT goals: Progressing toward goals  Acute Rehab OT Goals Patient Stated Goal: be able to transfer self OT Goal Formulation: With patient Time For Goal Achievement: 10/29/22 Potential to Achieve Goals:  Good ADL Goals Pt Will Perform Lower Body Bathing: with modified independence;sitting/lateral leans Pt Will Perform Lower Body Dressing: with modified independence;sitting/lateral leans Pt/caregiver will Perform Home Exercise Program: Increased strength;Both right and left upper extremity;With theraband;With theraputty;Independently;With written HEP provided Additional ADL Goal #1: Pt to complete all functional transfers to/from wheelchair with Supervision  Plan      Co-evaluation                 AM-PAC OT "6 Clicks" Daily Activity     Outcome Measure   Help from another person eating meals?: None Help from another person taking care of personal grooming?: A Little Help from another person toileting, which includes using toliet, bedpan, or urinal?: A Little Help from another person bathing (including washing, rinsing, drying)?: A Little Help from another person to put on and taking off regular upper body clothing?: A Little Help from another person to put on and taking off regular lower body clothing?: A Little 6 Click Score: 19    End of Session    OT Visit Diagnosis: Muscle weakness (generalized) (M62.81)   Activity Tolerance Patient tolerated treatment well   Patient Left in bed;with call bell/phone within reach;with family/visitor present   Nurse Communication          Time: 6578-4696 OT Time Calculation (min): 18 min  Charges: OT General Charges $OT Visit: 1 Visit OT Treatments $Therapeutic Activity: 8-22 mins  Bradd Canary, OTR/L Acute Rehab Services Office: [phone]   Lorre Munroe 10/18/2022, 11:07 AM

## 2022-10-18 NOTE — Progress Notes (Signed)
RT obtained NIF/VC with good patient effort.  NIF -40 VC 1.18L

## 2022-10-18 NOTE — Discharge Summary (Cosign Needed)
Name: Alexander Mcgrath MRN: [id number] DOB: 27-Dec-1985 37 y.o. PCP: Pcp, No  Date of Admission: 10/12/2022  1:15 PM Date of Discharge: 10/18/22 Attending Physician: Dr. Ninetta Lights, JEFFREY C   Discharge Diagnosis: Principal Problem:   Weakness Active Problems:   Numbness    Discharge Medications: Allergies as of 10/18/2022   No Known Allergies      Medication List     TAKE these medications    cyanocobalamin 1000 MCG tablet Take 1 tablet (1,000 mcg total) by mouth daily.   gabapentin 300 MG capsule Commonly known as: NEURONTIN Take 1 capsule (300 mg total) by mouth 2 (two) times daily.   ibuprofen 200 MG tablet Commonly known as: ADVIL Take 400 mg by mouth every 6 (six) hours as needed for headache or moderate pain.   lip balm ointment Apply topically as needed.   polyvinyl alcohol 1.4 % ophthalmic solution Commonly known as: LIQUIFILM TEARS Place 1 drop into both eyes as needed for dry eyes.        Disposition and follow-up:   Mr.Alexander Mcgrath was discharged from [facility] in Fair condition.  At the hospital follow up visit please address:  1.  Follow-up:  a.  Upper extremity strength   b.  Paresthesias in both upper lower extremity.  Follow-up Appointments: -Follow-up with Dr. Nita Sickle -Possible EMG/NCS  Hospital Course by problem list:  Bilateral Weakness  He initially presented with paresthesias in both upper and lower extremity , which now progressed to bilateral upper extremity weakness, dysarthria, dysphagia, and facial weakness.  On neuro exam,  patient is unable to use facial muscles to smile or frown. Otherwise facial movement is symmetric.    EKG showed diffuse ST elevation in noncontiguous leads.  Low suspicion for acute pericarditis given patient had no symptoms of chest pain or shortness of breath.  CT of the head, cervical spine and lumbar spine, were  normal.  MRI of the brain which was normal.  Lumbar  puncture under fluoroscopy was unsuccessful due to dextroscoliosis.  Given high suspicion for AIDP, he was treated with 5-day course of IVIG.  Numbness and tingling sensation has improved.  He also reports improvement in upper and strength.  He is a good candidate for inpatient rehabilitation. -Outpatient follow-up with Dr. Allena Katz -Outpatient EMG/NCS -Continue B12 1000 mcg daily for goal > 400 - Eye ointment at night and moisture chambers to prevent corneal abrasions given facial diplegia.    Discharge Subjective: Patient evaluated at bedside this AM.  Discharge Exam:   BP 119/83 (BP Location: Left Arm)   Pulse (!) 108   Temp 97.8 F (36.6 C) (Oral)   Resp 19   Ht 5\' 1"  (1.549 m)   Wt 45.4 kg   SpO2 100%   BMI 18.89 kg/m   Cardiovascular: regular rate and rhythm, no m/r/g Pulmonary/Chest: Lungs are clear bilaterally.   Neurological:  Facial sensation is symmetric, facial muscle use improving on right side when asked to smile, able to raise eyebrows more than yesterday.  Motor: 5/5 strength bilateral upper extremities Sensory: L>R Sensation in upper extremities, normal sensation in lower extremities   Pertinent Labs, Studies, and Procedures:     Latest Ref Rng & Units 10/18/2022    1:49 AM 10/17/2022    7:14 AM 10/16/2022    2:56 AM  CBC  WBC 4.0 - 10.5 K/uL 5.4  3.9  4.6   Hemoglobin 13.0 - 17.0 g/dL 75.6  43.3  29.5   Hematocrit  39.0 - 52.0 % 35.9  36.6  34.7   Platelets 150 - 400 K/uL 225  207  210        Latest Ref Rng & Units 10/18/2022    1:49 AM 10/17/2022    7:14 AM 10/16/2022    2:56 AM  CMP  Glucose 70 - 99 mg/dL 82  79  77   BUN 6 - 20 mg/dL 7  7  6    Creatinine 0.61 - 1.24 mg/dL 8.41  3.24  4.01   Sodium 135 - 145 mmol/L 138  136  137   Potassium 3.5 - 5.1 mmol/L 4.0  3.7  3.3   Chloride 98 - 111 mmol/L 105  104  106   CO2 22 - 32 mmol/L 26  23  25    Calcium 8.9 - 10.3 mg/dL 9.2  9.0  8.7   Total Protein 6.5 - 8.1 g/dL 7.9   6.7   Total Bilirubin 0.3 - 1.2  mg/dL 0.7   0.6   Alkaline Phos 38 - 126 U/L 31   28   AST 15 - 41 U/L 40   42   ALT 0 - 44 U/L 15   15     DG FL GUIDED LUMBAR PUNCTURE  Result Date: 10/13/2022 CLINICAL DATA:  37 year old male with remote history of polio and spinal tuberculosis presents with numbness and weakness in bilateral lower extremities which has spread to bilateral upper extremities as well. EXAM: DIAGNOSTIC LUMBAR PUNCTURE UNDER FLUOROSCOPIC GUIDANCE COMPARISON:  None Available. FLUOROSCOPY: Radiation Exposure Index (as provided by the fluoroscopic device): 28.3 mGy Kerma PROCEDURE: Informed consent was obtained from the patient prior to the procedure, including potential risks and complications of bleeding, infection, damage to adjacent structures, CSF leak, headache, allergy, and pain. With the patient prone, the lower back was prepped with Betadine. 1% Lidocaine was used for local anesthesia. Lumbar puncture was attempted at the L3/4 level using a 20 gauge needle, unsuccessful. Second lumbar puncture was attempted at the L4-L5 level, again unsuccessful. Dr. Reche Dixon was called to the procedure room for assistance. Dr. Reche Dixon attempted lumbar puncture at the L4-L5 level, unsuccessful. Despite optimal needle positioning (example series 4) CSF did not return. Another lumbar puncture attempt was made at the L5-S1 level, patient developed shooting sensation in his left leg, however CSF could not be obtained. Unsuccessful fluoro guided lumbar puncture. The patient tolerated the procedure well and there were no apparent complications. IMPRESSION: Unsuccessful fluoro guided lumbar puncture, as detailed above. Exam complicated by extensive thoracolumbar spine hardware. This procedure was performed by Lawernce Ion, PA-C under the supervision of Jeronimo Greaves, MD. Electronically Signed   By: Jeronimo Greaves M.D.   On: 10/13/2022 13:39   MR Brain W and Wo Contrast  Result Date: 10/12/2022 CLINICAL DATA:  Left lower extremity numbness. History of  polio and tuberculosis. EXAM: MRI HEAD WITHOUT AND WITH CONTRAST TECHNIQUE: Multiplanar, multiecho pulse sequences of the brain and surrounding structures were obtained without and with intravenous contrast. CONTRAST:  4mL GADAVIST GADOBUTROL 1 MMOL/ML IV SOLN COMPARISON:  None Available. FINDINGS: Brain: No acute infarct, mass effect or extra-axial collection. No acute or chronic hemorrhage. Normal white matter signal, parenchymal volume and CSF spaces. The midline structures are normal. Vascular: Major flow voids are preserved. Skull and upper cervical spine: Normal calvarium and skull base. Visualized upper cervical spine and soft tissues are normal. Sinuses/Orbits:No paranasal sinus fluid levels or advanced mucosal thickening. No mastoid or middle ear effusion. Normal orbits. IMPRESSION:  Normal brain MRI. Electronically Signed   By: Deatra Robinson M.D.   On: 10/12/2022 20:09   MR Cervical Spine W and Wo Contrast  Result Date: 10/12/2022 CLINICAL DATA:  Paresthesia. Remote history of polio. Remote history of spinal TB. 5 days of numbness in left leg that has now spread to his bilateral upper extremities. EXAM: MRI CERVICAL SPINE WITHOUT AND WITH CONTRAST TECHNIQUE: Multiplanar and multiecho pulse sequences of the cervical spine, to include the craniocervical junction and cervicothoracic junction, were obtained without and with intravenous contrast. CONTRAST:  4mL GADAVIST GADOBUTROL 1 MMOL/ML IV SOLN COMPARISON:  None Available. FINDINGS: Alignment: There is mild kyphotic angulation centered at C4-5. No sagittal spondylolisthesis. The atlantodens interval is intact. Vertebrae: Vertebral body heights are maintained. Minimal anterior C5-6 and C6-7 disc space narrowing. w within the limitations of patient motion artifact, no significant bone signal abnormality is seen. Cord: The cervical cord demonstrates normal signal and caliber. No abnormal cord enhancement. Posterior Fossa, vertebral arteries, paraspinal  tissues: Negative. Disc levels: C2-3: Mild bilateral uncovertebral hypertrophy. No posterior disc bulge. No central canal or neuroforaminal stenosis. C3-4: Mild bilateral facet joint hypertrophy. Mild left uncovertebral hypertrophy. Borderline mild left neuroforaminal stenosis. The right neural foramen is patent. No central canal stenosis. C4-5: Mild bilateral facet joint hypertrophy. No posterior disc bulge, central canal narrowing, or neuroforaminal stenosis. C5-6: Mild bilateral facet joint hypertrophy. Mild posterior disc bulge with mild bilateral intraforaminal extension. No significant neuroforaminal stenosis. No central canal stenosis. C6-7: Mild posterior disc bulge. CSF is still seen ventral to the cord. No central canal or neuroforaminal stenosis. C7-T1: No posterior disc bulge, central canal narrowing, or neuroforaminal stenosis. T1-2: Unremarkable. IMPRESSION: 1. Mild multilevel degenerative disc and joint changes as above. 2. Borderline mild left C3-4 neuroforaminal stenosis. 3. No central canal stenosis. Electronically Signed   By: Neita Garnet M.D.   On: 10/12/2022 19:50   DG Lumbar Spine Complete  Result Date: 10/12/2022 CLINICAL DATA:  Spinal fusion. EXAM: LUMBAR SPINE - COMPLETE 4+ VIEW COMPARISON:  January 04, 2006. 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 No fracture or spondylolisthesis is noted. Severe dextroscoliosis of thoracic spine is noted. Fusion of multiple disc spaces in the lower thoracic and upper lumbar spine is noted. IMPRESSION: Postsurgical changes as noted above.  No acute abnormality seen. Electronically Signed   By: Lupita Raider M.D.   On: 10/12/2022 16:42     Discharge Instructions:  Signed: Laretta Bolster, M.D.  Internal Medicine Resident, PGY-1 [facility]  Pager: [phone] 6:48 PM, 10/18/2022   **Please contact the on call pager after 5 pm and on weekends at [phone].**

## 2022-10-18 NOTE — Consult Note (Signed)
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  He was seen for these diagnoses at [facility] by pediatric PMR as well as Neuro.  Was seen by PCP 10/08/22 with c/o Left hand and tongue numbness, Left medial forearm numbness and was sent to ED at [facility] where C spine and Brain MRI showed no significant findings (mild degenerative changes in C spine) .  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  Has not had difficulty swallowing a minced diet or liquids using a straw  Currently at Mod A level for transfers to/from [facility] Review of Systems  Constitutional:  Negative for chills and fever.  HENT:  Negative for nosebleeds and sinus pain.   Eyes:  Negative for blurred vision, double vision and pain.  Respiratory:  Negative for cough, shortness of breath and stridor.   Cardiovascular:  Negative for chest pain and palpitations.  Gastrointestinal:  Negative for nausea and vomiting.  Genitourinary:  Negative for dysuria and urgency.  Musculoskeletal:  Negative for falls, joint pain and neck pain.  Skin:  Negative for itching.  Neurological:  Positive for tingling, sensory change and focal weakness.  Endo/Heme/Allergies:  Does not bruise/bleed easily.  Psychiatric/Behavioral:  Negative for hallucinations and memory loss.    Past Medical History:  Diagnosis Date   Polio    TB of vertebral column    Past Surgical History:  Procedure Laterality Date   ORIF FEMUR FRACTURE     SPINAL FUSION     TIBIA IM NAIL INSERTION Left    History reviewed. No pertinent family history. Social History:  reports that he has never smoked. He has never used smokeless tobacco. No  history on file for alcohol use and drug use. Allergies: No Known Allergies Medications Prior to Admission  Medication Sig Dispense Refill   ibuprofen (ADVIL) 200 MG tablet Take 400 mg by mouth every 6 (six) hours as needed for headache or moderate pain.      Home: Home Living Family/patient expects to be discharged to:: Private residence Living Arrangements: Alone Available Help at Discharge: Family, Available 24 hours/day Type of Home: House Home Access: Ramped entrance Home Layout: One level Bathroom Shower/Tub: Engineer, manufacturing systems: Standard Bathroom Accessibility: Yes Home Equipment: Wheelchair - manual, Tub bench, Other (comment) (vehicle with hand controls)  Functional History: Prior Function Prior Level of Function : Independent/Modified Independent, Working/employed, Driving Mobility Comments: Pt is mod I from w/c level, typically "throws self" into w/c that does not have brakes. reports if he falls out of the w/c, he can get himself back into the chair ADLs Comments: works as a Psychologist, occupational, drives (Comptroller), plays adaptive basketball Functional Status:  Mobility: Bed Mobility Overal bed mobility: Modified Independent Bed Mobility: Supine to Sit, Sit to Supine Sidelying to sit: Supervision, HOB elevated Supine to sit: Modified independent (Device/Increase time) Sit to supine: Modified independent (Device/Increase time) General bed mobility comments: uses BUE to move BLE to EOB, no physical assist needed with bed mobility Transfers Overall transfer level: Needs assistance Equipment used: Sliding board Transfers: Bed to chair/wheelchair/BSC Bed to/from chair/wheelchair/BSC transfer type:: Lateral/scoot transfer Squat pivot transfers: Mod assist  Lateral/Scoot Transfers: Min  assist, +2 safety/equipment, With slide board General transfer comment: Min A  x 2 for sliding board transfer to/from w/c with close hands on assist for balance   Wheelchair  Mobility Wheelchair mobility: Yes Wheelchair propulsion: Both upper extremities Wheelchair parts: Independent Distance: 250 feet Wheelchair Assistance Details (indicate cue type and reason): Pt's lightweight manual w/c does not have brakes.  ADL: ADL Overall ADL's : Needs assistance/impaired Eating/Feeding: Set up, Bed level Grooming: Contact guard assist, Sitting Upper Body Bathing: Minimal assistance, Sitting Lower Body Bathing: Sitting/lateral leans, Bed level, Minimal assistance Upper Body Dressing : Minimal assistance, Sitting Lower Body Dressing: Minimal assistance, Sitting/lateral leans, Sit to/from stand Toileting- Clothing Manipulation and Hygiene: Minimal assistance, Sitting/lateral lean General ADL Comments: Focus on trial of sliding board for safe transfers, discussion of modification of ADLs w/ pt reporting able to manage bed level with Setup assist this AM. educated on progression of UE HEP, pushing through BUE on bed + sustaining this action to maximize transfer abilities  Cognition: Cognition Overall Cognitive Status: Within Functional Limits for tasks assessed Orientation Level: Oriented X4 Cognition Arousal: Alert Behavior During Therapy: [facility] for tasks assessed/performed, Flat affect Overall Cognitive Status: Within Functional Limits for tasks assessed  Blood pressure 116/79, pulse 93, temperature 98 F (36.7 C), temperature source Oral, resp. rate 18, height 5\' 1"  (1.549 m), weight 45.4 kg, SpO2 100%. Physical Exam Vitals and nursing note reviewed.  Constitutional:      Appearance: He is ill-appearing.  HENT:     Head: Normocephalic and atraumatic.     Nose: No congestion or rhinorrhea.  Eyes:     Extraocular Movements: Extraocular movements intact.     Conjunctiva/sclera: Conjunctivae normal.     Comments: + incomplete lid closure   Cardiovascular:     Rate and Rhythm: Normal rate and regular rhythm.     Heart sounds: Normal heart sounds. No murmur  heard. Pulmonary:     Effort: Pulmonary effort is normal.     Breath sounds: Normal breath sounds. No stridor.  Abdominal:     General: Bowel sounds are normal. There is no distension.     Tenderness: There is no abdominal tenderness.  Musculoskeletal:     Cervical back: Normal range of motion.  Skin:    General: Skin is warm and dry.  Neurological:     Mental Status: He is alert and oriented to person, place, and time.     Gait: Gait abnormal.     Comments:   Neuro:  Eyes without evidence of nystagmus  Tone is normal without evidence of spasticity Cerebellar exam shows mild  evidence of ataxia onbilateral  finger nose finger , did not perform heel to shin testing due to LE weakness No evidence of trunkal ataxia  Motor strength is 5/5 in right and 4- Left  deltoid, biceps, triceps, finger flexors and extensors, wrist flexors and extensors,0/5  hip flexors, knee flexors and extensors, ankle dorsiflexors, plantar flexors, invertors and evertors,trace  toe flexors and 0/5 extensors  Sensory exam is normal to light touch in the upper and lower limbs   Cranial nerves II- Visual fields are intact to confrontation testing, no blurring of vision III- no evidence of ptosis, upward, downward and medial gaze intact IV- no vertical diplopia or head tilt V- no facial numbness or masseter weakness VI- no pupil abduction weakness VII- no facial droop, good lid closure VII- normal auditory acuity IX- no pharygeal weakness,  X- no pharyngeal weakness, no hoarseness XI- no trap or  SCM weakness XII- no glossal weakness     Psychiatric:        Mood and Affect: Mood normal.        Behavior: Behavior normal.     Results for orders placed or performed during the hospital encounter of 10/12/22 (from the past 24 hour(s))  Comprehensive metabolic panel     Status: Abnormal   Collection Time: 10/18/22  1:49 AM  Result Value Ref Range   Sodium 138 135 - 145 mmol/L   Potassium 4.0 3.5 - 5.1  mmol/L   Chloride 105 98 - 111 mmol/L   CO2 26 22 - 32 mmol/L   Glucose, Bld 82 70 - 99 mg/dL   BUN 7 6 - 20 mg/dL   Creatinine, Ser 1.61 (L) 0.61 - 1.24 mg/dL   Calcium 9.2 8.9 - 09.6 mg/dL   Total Protein 7.9 6.5 - 8.1 g/dL   Albumin 3.1 (L) 3.5 - 5.0 g/dL   AST 40 15 - 41 U/L   ALT 15 0 - 44 U/L   Alkaline Phosphatase 31 (L) 38 - 126 U/L   Total Bilirubin 0.7 0.3 - 1.2 mg/dL   GFR, Estimated >04 >54 mL/min   Anion gap 7 5 - 15  CBC     Status: Abnormal   Collection Time: 10/18/22  1:49 AM  Result Value Ref Range   WBC 5.4 4.0 - 10.5 K/uL   RBC 4.02 (L) 4.22 - 5.81 MIL/uL   Hemoglobin 12.0 (L) 13.0 - 17.0 g/dL   HCT 09.8 (L) 11.9 - 14.7 %   MCV 89.3 80.0 - 100.0 fL   MCH 29.9 26.0 - 34.0 pg   MCHC 33.4 30.0 - 36.0 g/dL   RDW 82.9 56.2 - 13.0 %   Platelets 225 150 - 400 K/uL   nRBC 0.0 0.0 - 0.2 %   No results found.  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 Yes Does the patient require coordinated care of a physician, rehab nurse, therapy disciplines of PT, OT to address physical and functional deficits in the context of the above medical diagnosis(es)? Yes Addressing deficits in the following areas: balance, endurance, locomotion, strength, transferring, bowel/bladder control, bathing, dressing, feeding,  grooming, toileting, swallowing, and psychosocial support Can the patient actively participate in an intensive therapy program of at least 3 hrs of therapy per day at least 5 days per week? Yes The potential for patient to make measurable gains while on inpatient rehab is good Anticipated functional outcomes upon discharge from inpatient rehab are modified independent  with PT, modified independent with OT, modified independent with SLP. Estimated rehab length of stay to reach the above functional goals is: 14-16 d Anticipated discharge destination: Home Overall Rehab/Functional Prognosis: good  POST ACUTE RECOMMENDATIONS: This patient's condition is appropriate for continued rehabilitative care in the following setting: CIR Patient has agreed to participate in recommended program. Yes Note that insurance prior authorization may be  required for reimbursement for recommended care.  Comment: discussed with pt and father tht goal of inpt rehab is working on basic mobility and ADLs and vocational goals will be pursued once pt is an outpt.    MEDICAL RECOMMENDATIONS: Please note any pending labs on discharge summary   I have personally performed a face to face diagnostic evaluation of this patient. Additionally, I have examined the patient's medical record including any pertinent labs and radiographic images. Thanks,  Erick Colace, MD 10/18/2022

## 2022-10-19 ENCOUNTER — Encounter: Payer: Self-pay | Admitting: Neurology

## 2022-10-19 ENCOUNTER — Other Ambulatory Visit: Payer: Self-pay

## 2022-10-19 ENCOUNTER — Inpatient Hospital Stay (HOSPITAL_COMMUNITY)
Admission: RE | Admit: 2022-10-19 | Discharge: 2022-10-26 | DRG: 095 | Disposition: A | Discharge status: Home / Self Care | Payer: BC Managed Care – PPO | Source: Intra-hospital | Attending: Physical Medicine and Rehabilitation | Admitting: Physical Medicine and Rehabilitation

## 2022-10-19 ENCOUNTER — Encounter (HOSPITAL_COMMUNITY): Payer: Self-pay | Admitting: Physical Medicine and Rehabilitation

## 2022-10-19 DIAGNOSIS — G610 Guillain-Barre syndrome: Secondary | ICD-10-CM

## 2022-10-19 DIAGNOSIS — R4589 Other symptoms and signs involving emotional state: Secondary | ICD-10-CM | POA: Diagnosis not present

## 2022-10-19 DIAGNOSIS — E440 Moderate protein-calorie malnutrition: Secondary | ICD-10-CM | POA: Diagnosis not present

## 2022-10-19 DIAGNOSIS — R29810 Facial weakness: Secondary | ICD-10-CM | POA: Diagnosis not present

## 2022-10-19 DIAGNOSIS — G8220 Paraplegia, unspecified: Secondary | ICD-10-CM | POA: Diagnosis present

## 2022-10-19 DIAGNOSIS — M419 Scoliosis, unspecified: Secondary | ICD-10-CM | POA: Diagnosis present

## 2022-10-19 DIAGNOSIS — Z981 Arthrodesis status: Secondary | ICD-10-CM

## 2022-10-19 DIAGNOSIS — K5909 Other constipation: Secondary | ICD-10-CM | POA: Diagnosis not present

## 2022-10-19 DIAGNOSIS — Z8612 Personal history of poliomyelitis: Secondary | ICD-10-CM

## 2022-10-19 DIAGNOSIS — R531 Weakness: Secondary | ICD-10-CM | POA: Diagnosis not present

## 2022-10-19 DIAGNOSIS — Z79899 Other long term (current) drug therapy: Secondary | ICD-10-CM

## 2022-10-19 DIAGNOSIS — Z8611 Personal history of tuberculosis: Secondary | ICD-10-CM

## 2022-10-19 DIAGNOSIS — Z993 Dependence on wheelchair: Secondary | ICD-10-CM | POA: Diagnosis not present

## 2022-10-19 DIAGNOSIS — Z681 Body mass index (BMI) 19 or less, adult: Secondary | ICD-10-CM | POA: Diagnosis not present

## 2022-10-19 MED ORDER — POLYVINYL ALCOHOL 1.4 % OP SOLN: 1.0000 [drp] | OPHTHALMIC | Status: DC | PRN

## 2022-10-19 MED ORDER — IBUPROFEN 600 MG PO TABS: 600.0000 mg | ORAL_TABLET | Freq: Four times a day (QID) | ORAL | Status: DC | PRN

## 2022-10-19 MED ORDER — ACETAMINOPHEN 325 MG PO TABS: 650.0000 mg | ORAL_TABLET | Freq: Four times a day (QID) | ORAL | Status: DC | PRN

## 2022-10-19 MED ORDER — ADULT MULTIVITAMIN W/MINERALS CH
1.0000 | ORAL_TABLET | Freq: Every day | ORAL | Status: DC
  Administered 2022-10-20 – 2022-10-26 (×7): 1 via ORAL
  Filled 2022-10-19 (×8): qty 1

## 2022-10-19 MED ORDER — HYDROXYZINE HCL 25 MG PO TABS: 25.0000 mg | ORAL_TABLET | Freq: Three times a day (TID) | ORAL | Status: DC | PRN

## 2022-10-19 MED ORDER — CARMEX CLASSIC LIP BALM EX OINT: TOPICAL_OINTMENT | CUTANEOUS | Status: DC | PRN

## 2022-10-19 MED ORDER — GABAPENTIN 300 MG PO CAPS
300.0000 mg | ORAL_CAPSULE | Freq: Two times a day (BID) | ORAL | Status: DC
  Administered 2022-10-19 – 2022-10-26 (×14): 300 mg via ORAL
  Filled 2022-10-19 (×14): qty 1

## 2022-10-19 MED ORDER — CYANOCOBALAMIN 1000 MCG PO TABS: 1000.0000 ug | ORAL_TABLET | Freq: Every day | ORAL | Status: DC

## 2022-10-19 MED ORDER — VITAMIN B-12 1000 MCG PO TABS
1000.0000 ug | ORAL_TABLET | Freq: Every day | ORAL | Status: DC
  Administered 2022-10-20 – 2022-10-26 (×7): 1000 ug via ORAL
  Filled 2022-10-19 (×8): qty 1

## 2022-10-19 MED ORDER — MELATONIN 5 MG PO TABS
5.0000 mg | ORAL_TABLET | Freq: Every day | ORAL | Status: DC
  Administered 2022-10-19 – 2022-10-25 (×7): 5 mg via ORAL
  Filled 2022-10-19 (×7): qty 1

## 2022-10-19 MED ORDER — GABAPENTIN 300 MG PO CAPS: 300.0000 mg | ORAL_CAPSULE | Freq: Two times a day (BID) | ORAL | Status: DC

## 2022-10-19 NOTE — H&P (Signed)
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  He was seen for these diagnosis at [facility] by pediatric PM&R as well as neurology.  Per chart review patient lives alone.  1 level home with ramped entrance.  Modified independent wheelchair level.  He works as a Psychologist, occupational.  He still drives and plays adaptive basketball.  Patient was last seen by PCP 10/08/2022 with chief complaint of left hand and tongue numbness, left medial forearm numbness and was sent to the ED at [facility] for a C-spine and brain MRI showed no significant findings except mild degenerative changes of the cervical spine.  He was admitted 10/12/2022 to [facility].  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  Neurology follow-up during hospital stay placed on vitamin B12.  Neurontin ongoing for neuropathic pain.  Plans to follow-up with Dr. Nita Sickle at [facility] neurology on discharge.     Pt reports LBM was 1 weeks ago- he usually goes 1-2x/week and feels this is normal.  Has tingling discomfort in hands/palms and it' smild- doesn't think needs meds for this.  Tongue is numb and reports cannot smile.  Voids a lot.  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  York Spaniel is weak in B/L Ue's .    Review of Systems  Constitutional:  Negative for chills and fever.  HENT:  Negative for hearing loss.   Eyes:  Negative for blurred vision and double vision.  Respiratory:  Negative for cough, shortness of breath and wheezing.    Cardiovascular:  Negative for chest pain, palpitations and leg swelling.  Gastrointestinal:  Positive for constipation. Negative for heartburn, nausea and vomiting.  Genitourinary:  Negative for dysuria, flank pain and hematuria.  Musculoskeletal:  Positive for falls, joint pain and myalgias.  Skin:  Negative for rash.  Neurological:  Positive for sensory change.       Paraparesis  All other systems reviewed and are negative.       Past Medical History:  Diagnosis Date   Polio     TB of vertebral column               Past Surgical History:  Procedure Laterality Date   ORIF FEMUR FRACTURE       SPINAL FUSION       TIBIA IM NAIL INSERTION Left          History reviewed. No pertinent family history.     Social History:  reports that he has never smoked. He has never used smokeless tobacco. No history on file for alcohol use and drug use. Allergies:  Allergies  No Known Allergies         Medications Prior to Admission  Medication Sig Dispense Refill   ibuprofen (ADVIL) 200 MG tablet Take 400 mg by mouth every 6 (six) hours as needed for headache or moderate pain.                  Home:  Home Living Family/patient expects to be discharged to:: Private residence Living Arrangements: Alone Available Help at Discharge: Family, Available 24 hours/day Type of Home: House Home Access: Ramped entrance Home Layout: One level Bathroom Shower/Tub: Engineer, manufacturing systems: Standard Bathroom Accessibility: Yes Home Equipment: Wheelchair - manual, Tub bench, Other (comment) (vehicle with hand controls)   Functional History: Prior Function Prior Level of Function : Independent/Modified Independent, Working/employed, Driving Mobility Comments: Pt is mod I from w/c level, typically "throws self" into w/c that does not have brakes. reports if he falls out of the w/c, he can get himself back into the chair ADLs Comments: works as a Psychologist, occupational, drives (Comptroller),  plays adaptive basketball   Functional Status:  Mobility: Bed Mobility Overal bed mobility: Modified Independent Bed Mobility: Supine to Sit, Sit to Supine Sidelying to sit: Supervision, HOB elevated Supine to sit: Modified independent (Device/Increase time) Sit to supine: Modified independent (Device/Increase time) General bed mobility comments: uses BUE to move BLE to EOB, no physical assist needed with bed mobility Transfers Overall transfer level: Needs assistance Equipment used: Sliding board Transfers: Bed to chair/wheelchair/BSC Bed to/from chair/wheelchair/BSC transfer type:: Lateral/scoot transfer Squat pivot transfers: Mod assist  Lateral/Scoot Transfers: Min assist, +2 safety/equipment, With slide board General transfer comment: Min A  x 2 for sliding board transfer to/from w/c with close hands on assist for balance Wheelchair Mobility Wheelchair mobility: Yes Wheelchair propulsion: Both upper extremities Wheelchair parts: Independent Distance: 250 feet Wheelchair Assistance Details (indicate cue type and reason): Pt's lightweight manual w/c does not have brakes.   ADL: ADL Overall ADL's : Needs assistance/impaired Eating/Feeding: Set up, Bed level Grooming: Contact guard assist, Sitting Upper Body Bathing: Minimal assistance, Sitting Lower Body Bathing: Sitting/lateral leans, Bed level, Minimal assistance Upper Body Dressing : Minimal assistance, Sitting Lower Body Dressing: Minimal assistance, Sitting/lateral leans, Sit to/from stand Toileting- Clothing Manipulation and Hygiene: Minimal assistance, Sitting/lateral lean General ADL Comments: Focus on trial of sliding board for safe transfers, discussion of modification of ADLs w/ pt reporting able to manage bed level with Setup assist this AM. educated on progression of UE HEP, pushing through BUE on bed + sustaining this action to maximize transfer abilities   Cognition: Cognition Overall Cognitive Status: Within  Functional Limits for tasks assessed Orientation Level: Oriented X4 Cognition Arousal: Alert Behavior During Therapy: WFL for tasks assessed/performed, Flat affect Overall Cognitive Status: Within Functional Limits for tasks assessed   Physical Exam: Blood pressure 114/70, pulse 99, temperature 97.8 F (36.6 C), temperature source Oral, resp. rate 18, height 5\' 1"  (1.549 m), weight 45.4 kg, SpO2 95%. Physical Exam Vitals and nursing note reviewed.  Constitutional:      General: He is not in acute distress.    Appearance: He is not ill-appearing.     Comments: Pt awake, alert, appropriate, sitting up in bed; father at bedside; NAD  HENT:     Head: Normocephalic and atraumatic.     Comments: No smile possible- so B/L facial droop R tongue deviation Intact to sensation on face B/L EOMI B/L    Right Ear: External ear normal.     Left Ear: External ear normal.     Nose: Nose normal. No congestion.     Mouth/Throat:     Mouth: Mucous membranes are dry.     Pharynx: Oropharynx is clear. No oropharyngeal exudate.  Eyes:     General:        Right eye: No discharge.  Left eye: No discharge.     Extraocular Movements: Extraocular movements intact.  Cardiovascular:     Rate and Rhythm: Normal rate and regular rhythm.     Heart sounds: Normal heart sounds. No murmur heard.    No gallop.  Pulmonary:     Effort: Pulmonary effort is normal. No respiratory distress.     Breath sounds: Normal breath sounds. No wheezing, rhonchi or rales.  Abdominal:     General: There is no distension.     Palpations: Abdomen is soft.     Tenderness: There is no abdominal tenderness.     Comments: Hypoactive BS  Musculoskeletal:     Cervical back: Neck supple. No tenderness.     Comments: RUE- biceps 4+/5' Triceps 4+/5; WE 4+/5; grip 4/5 and FA 2+/5 LUE- biceps 4/5; Triceps 3+/5; WE 4-/5; Grip 3+/5 and FA 2/5 LE's-t race movement in HF/KE- on LLE- but otherwise 0/5 Muscle atrophy of B/L LE's-  due to polio  Skin:    General: Skin is warm and dry.     Comments: No skin breakdown seen  Neurological:     Mental Status: He is alert.     Comments: Patient is alert.  No acute distress.  Sitting up in bed with father at bedside.  Speech is a bit dysarthric but intelligible.  Oriented x 3. Polio affecting legs and spinal TB- causing severe muscle atrophy- said sensation intact  Psychiatric:        Mood and Affect: Mood normal.        Behavior: Behavior normal.        Lab Results Last 48 Hours        Results for orders placed or performed during the hospital encounter of 10/12/22 (from the past 48 hour(s))  Comprehensive metabolic panel     Status: Abnormal    Collection Time: 10/18/22  1:49 AM  Result Value Ref Range    Sodium 138 135 - 145 mmol/L    Potassium 4.0 3.5 - 5.1 mmol/L    Chloride 105 98 - 111 mmol/L    CO2 26 22 - 32 mmol/L    Glucose, Bld 82 70 - 99 mg/dL      Comment: Glucose reference range applies only to samples taken after fasting for at least 8 hours.    BUN 7 6 - 20 mg/dL    Creatinine, Ser 1.61 (L) 0.61 - 1.24 mg/dL    Calcium 9.2 8.9 - 09.6 mg/dL    Total Protein 7.9 6.5 - 8.1 g/dL    Albumin 3.1 (L) 3.5 - 5.0 g/dL    AST 40 15 - 41 U/L    ALT 15 0 - 44 U/L    Alkaline Phosphatase 31 (L) 38 - 126 U/L    Total Bilirubin 0.7 0.3 - 1.2 mg/dL    GFR, Estimated >04 >54 mL/min      Comment: (NOTE) Calculated using the CKD-EPI Creatinine Equation (2021)      Anion gap 7 5 - 15      Comment: Performed at [facility], [address] [place], [place] [address]  CBC     Status: Abnormal    Collection Time: 10/18/22  1:49 AM  Result Value Ref Range    WBC 5.4 4.0 - 10.5 K/uL    RBC 4.02 (L) 4.22 - 5.81 MIL/uL    Hemoglobin 12.0 (L) 13.0 - 17.0 g/dL    HCT 91.4 (L) 78.2 - 52.0 %  MCV 89.3 80.0 - 100.0 fL    MCH 29.9 26.0 - 34.0 pg    MCHC 33.4 30.0 - 36.0 g/dL    RDW 28.3 15.1 - 76.1 %    Platelets 225 150 - 400 K/uL    nRBC 0.0 0.0 - 0.2 %       Comment: Performed at [facility], [address], [place], [place] [address]      Imaging Results (Last 48 hours)  No results found.         Blood pressure 114/70, pulse 99, temperature 97.8 F (36.6 C), temperature source Oral, resp. rate 18, height 5\' 1"  (1.549 m), weight 45.4 kg, SpO2 95%.   Medical Problem List and Plan: 1. 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  Completed IVIG.             -patient may  shower             -ELOS/Goals: 16-20 days- mod I at w/c level             Admit to CIR- discussed prognosis with pt/father 2.  Antithrombotics: -DVT/anticoagulation:  Mechanical: Antiembolism stockings, thigh (TED hose) Bilateral lower extremities             -antiplatelet therapy: N/A 3. Pain Management: Neurontin 300 mg twice daily, Advil as needed 4. Mood/Behavior/Sleep: Melatonin nightly, Atarax as needed anxiety.  Provide emotional support             -antipsychotic agents: N/A 5. Neuropsych/cognition: This patient is capable of making decisions on his own behalf. 6. Skin/Wound Care: Routine skin checks 7. Fluids/Electrolytes/Nutrition: Routine in and outs with follow-up chemistries. 8. Chronic constipation- LBM 1 week ago- he doesn't think he needs assistance with this 9. W/C- is 37 years old- will see if pt wants [facility] to do new w/c eval while in-pt.  10. Nerve pain- doesn't want meds other than gabapentin 300 mg BID             Charlton Amor, PA-C 10/19/2022     I have personally performed a face to face diagnostic evaluation of this patient and formulated the key components of the plan.  Additionally, I have personally reviewed laboratory data, imaging studies, as well as relevant notes and concur with the physician assistant's documentation above.   The patient's status has not changed from the original H&P.  Any changes in documentation from the acute care chart have been noted  above.

## 2022-10-19 NOTE — Progress Notes (Signed)
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  He was seen for these diagnoses at [facility] by pediatric PMR as well as Neuro.  Was seen by PCP 10/08/22 with c/o Left hand and tongue numbness, Left medial forearm numbness and was sent to ED at [facility] where C spine and Brain MRI showed no significant findings (mild degenerative changes in C spine) .  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  Has not had difficulty swallowing a minced diet or liquids using a straw   Currently at Mod A level for transfers to/from [facility] Review of Systems  Constitutional:  Negative for chills and fever.  HENT:  Negative for nosebleeds and sinus pain.   Eyes:  Negative for blurred vision, double vision and pain.  Respiratory:  Negative for cough, shortness of breath and stridor.   Cardiovascular:  Negative for chest pain and palpitations.  Gastrointestinal:  Negative for nausea and vomiting.  Genitourinary:  Negative for dysuria and urgency.  Musculoskeletal:  Negative for falls, joint pain and neck pain.  Skin:  Negative for itching.  Neurological:  Positive for tingling, sensory change and focal weakness.  Endo/Heme/Allergies:  Does not bruise/bleed easily.   Psychiatric/Behavioral:  Negative for hallucinations and memory loss.         Past Medical History:  Diagnosis Date   Polio     TB of vertebral column               Past Surgical History:  Procedure Laterality Date   ORIF FEMUR FRACTURE       SPINAL FUSION       TIBIA IM NAIL INSERTION Left          History reviewed. No pertinent family history.     Social History:  reports that he has never smoked. He has never used smokeless tobacco. No history on file for alcohol use and drug use. Allergies:  Allergies  No Known Allergies         Medications Prior to Admission  Medication Sig Dispense Refill   ibuprofen (ADVIL) 200 MG tablet Take 400 mg by mouth every 6 (six) hours as needed for headache or moderate pain.              Home: Home Living Family/patient expects to be discharged to:: Private residence Living Arrangements: Alone Available Help at Discharge: Family, Available 24 hours/day Type of Home: House Home Access: Ramped entrance Home Layout: One level Bathroom Shower/Tub: Engineer, manufacturing systems: Standard Bathroom Accessibility: Yes Home Equipment: Wheelchair - manual, Tub bench, Other (comment) (vehicle with hand controls)  Functional History: Prior Function Prior Level of Function : Independent/Modified Independent, Working/employed, Driving Mobility Comments: Pt is mod I from w/c  level, typically "throws self" into w/c that does not have brakes. reports if he falls out of the w/c, he can get himself back into the chair ADLs Comments: works as a Psychologist, occupational, drives (Comptroller), plays adaptive basketball Functional Status:  Mobility: Bed Mobility Overal bed mobility: Modified Independent Bed Mobility: Supine to Sit, Sit to Supine Sidelying to sit: Supervision, HOB elevated Supine to sit: Modified independent (Device/Increase time) Sit to supine: Modified independent (Device/Increase time) General bed mobility comments: uses BUE to move  BLE to EOB, no physical assist needed with bed mobility Transfers Overall transfer level: Needs assistance Equipment used: Sliding board Transfers: Bed to chair/wheelchair/BSC Bed to/from chair/wheelchair/BSC transfer type:: Lateral/scoot transfer Squat pivot transfers: Mod assist  Lateral/Scoot Transfers: Min assist, +2 safety/equipment, With slide board General transfer comment: Min A  x 2 for sliding board transfer to/from w/c with close hands on assist for balance Wheelchair Mobility Wheelchair mobility: Yes Wheelchair propulsion: Both upper extremities Wheelchair parts: Independent Distance: 250 feet Wheelchair Assistance Details (indicate cue type and reason): Pt's lightweight manual w/c does not have brakes.   ADL: ADL Overall ADL's : Needs assistance/impaired Eating/Feeding: Set up, Bed level Grooming: Contact guard assist, Sitting Upper Body Bathing: Minimal assistance, Sitting Lower Body Bathing: Sitting/lateral leans, Bed level, Minimal assistance Upper Body Dressing : Minimal assistance, Sitting Lower Body Dressing: Minimal assistance, Sitting/lateral leans, Sit to/from stand Toileting- Clothing Manipulation and Hygiene: Minimal assistance, Sitting/lateral lean General ADL Comments: Focus on trial of sliding board for safe transfers, discussion of modification of ADLs w/ pt reporting able to manage bed level with Setup assist this AM. educated on progression of UE HEP, pushing through BUE on bed + sustaining this action to maximize transfer abilities   Cognition: Cognition Overall Cognitive Status: Within Functional Limits for tasks assessed Orientation Level: Oriented X4 Cognition Arousal: Alert Behavior During Therapy: [facility] for tasks assessed/performed, Flat affect Overall Cognitive Status: Within Functional Limits for tasks assessed   Blood pressure 116/79, pulse 93, temperature 98 F (36.7 C), temperature source Oral, resp. rate 18, height 5\' 1"  (1.549 m), weight  45.4 kg, SpO2 100%. Physical Exam Vitals and nursing note reviewed.  Constitutional:      Appearance: He is ill-appearing.  HENT:     Head: Normocephalic and atraumatic.     Nose: No congestion or rhinorrhea.  Eyes:     Extraocular Movements: Extraocular movements intact.     Conjunctiva/sclera: Conjunctivae normal.     Comments: + incomplete lid closure   Cardiovascular:     Rate and Rhythm: Normal rate and regular rhythm.     Heart sounds: Normal heart sounds. No murmur heard. Pulmonary:     Effort: Pulmonary effort is normal.     Breath sounds: Normal breath sounds. No stridor.  Abdominal:     General: Bowel sounds are normal. There is no distension.     Tenderness: There is no abdominal tenderness.  Musculoskeletal:     Cervical back: Normal range of motion.  Skin:    General: Skin is warm and dry.  Neurological:     Mental Status: He is alert and oriented to person, place, and time.     Gait: Gait abnormal.     Comments:    Neuro:  Eyes without evidence of nystagmus  Tone is normal without evidence of spasticity Cerebellar exam shows mild  evidence of ataxia onbilateral  finger nose finger , did not perform heel to shin testing due to LE weakness No evidence of trunkal  ataxia   Motor strength is 5/5 in right and 4- Left  deltoid, biceps, triceps, finger flexors and extensors, wrist flexors and extensors,0/5  hip flexors, knee flexors and extensors, ankle dorsiflexors, plantar flexors, invertors and evertors,trace  toe flexors and 0/5 extensors   Sensory exam is normal to light touch in the upper and lower limbs    Cranial nerves II- Visual fields are intact to confrontation testing, no blurring of vision III- no evidence of ptosis, upward, downward and medial gaze intact IV- no vertical diplopia or head tilt V- no facial numbness or masseter weakness VI- no pupil abduction weakness VII- no facial droop, good lid closure VII- normal auditory acuity IX- no pharygeal  weakness,  X- no pharyngeal weakness, no hoarseness XI- no trap or SCM weakness XII- no glossal weakness      Psychiatric:        Mood and Affect: Mood normal.        Behavior: Behavior normal.        Lab Results Last 24 Hours       Results for orders placed or performed during the hospital encounter of 10/12/22 (from the past 24 hour(s))  Comprehensive metabolic panel     Status: Abnormal    Collection Time: 10/18/22  1:49 AM  Result Value Ref Range    Sodium 138 135 - 145 mmol/L    Potassium 4.0 3.5 - 5.1 mmol/L    Chloride 105 98 - 111 mmol/L    CO2 26 22 - 32 mmol/L    Glucose, Bld 82 70 - 99 mg/dL    BUN 7 6 - 20 mg/dL    Creatinine, Ser 8.65 (L) 0.61 - 1.24 mg/dL    Calcium 9.2 8.9 - 78.4 mg/dL    Total Protein 7.9 6.5 - 8.1 g/dL    Albumin 3.1 (L) 3.5 - 5.0 g/dL    AST 40 15 - 41 U/L    ALT 15 0 - 44 U/L    Alkaline Phosphatase 31 (L) 38 - 126 U/L    Total Bilirubin 0.7 0.3 - 1.2 mg/dL    GFR, Estimated >69 >62 mL/min    Anion gap 7 5 - 15  CBC     Status: Abnormal    Collection Time: 10/18/22  1:49 AM  Result Value Ref Range    WBC 5.4 4.0 - 10.5 K/uL    RBC 4.02 (L) 4.22 - 5.81 MIL/uL    Hemoglobin 12.0 (L) 13.0 - 17.0 g/dL    HCT 95.2 (L) 84.1 - 52.0 %    MCV 89.3 80.0 - 100.0 fL    MCH 29.9 26.0 - 34.0 pg    MCHC 33.4 30.0 - 36.0 g/dL    RDW 32.4 40.1 - 02.7 %    Platelets 225 150 - 400 K/uL    nRBC 0.0 0.0 - 0.2 %      Imaging Results (Last 48 hours)  No results found.     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 Yes Co-Morbidities requiring supervision/potential complications:  -chronic paraparesis from polio, WC bound, hx of severe  scoliosis s/p instumentation ,  Due to bladder management, bowel management, safety, skin/wound care, disease management,  medication administration, pain management, and patient education, does the patient require 24 hr/day rehab nursing? Yes Does the patient require coordinated care of a physician, rehab nurse, therapy disciplines of PT, OT to address physical and functional deficits in the context of the above medical diagnosis(es)? Yes Addressing deficits in the following areas: balance, endurance, locomotion, strength, transferring, bowel/bladder control, bathing, dressing, feeding, grooming, toileting, swallowing, and psychosocial support Can the patient actively participate in an intensive therapy program of at least 3 hrs of therapy per day at least 5 days per week? Yes The potential for patient to make measurable gains while on inpatient rehab is good Anticipated functional outcomes upon discharge from inpatient rehab are modified independent  with PT, modified independent with OT, modified independent with SLP. Estimated rehab length of stay to reach the above functional goals is: 14-16 d Anticipated discharge destination: Home Overall Rehab/Functional Prognosis: good   POST ACUTE RECOMMENDATIONS: This patient's condition is appropriate for continued rehabilitative care in the following setting: CIR Patient has agreed to participate in recommended program. Yes Note that insurance prior authorization may be required for reimbursement for recommended care.   Comment: discussed with pt and father tht goal of inpt rehab is working on basic mobility and ADLs and vocational goals will be pursued once pt is an outpt.      MEDICAL RECOMMENDATIONS: Please note any pending labs on discharge summary     I have personally performed a face to face diagnostic evaluation of this patient. Additionally, I have examined the patient's medical record including any pertinent labs and radiographic images.  Thanks,   Erick Colace, MD 10/18/2022

## 2022-10-19 NOTE — Progress Notes (Signed)
Inpatient Rehabilitation Admission Medication Review by a Pharmacist  A complete drug regimen review was completed for this patient to identify any potential clinically significant medication issues.  High Risk Drug Classes Is patient taking? Indication by Medication  Antipsychotic No   Anticoagulant No   Antibiotic No   Opioid No   Antiplatelet No   Hypoglycemics/insulin No   Vasoactive Medication No   Chemotherapy No   Other Yes Gabapentin - pain Hydroxyzine prn anxiety Ibuprofen prn pain     Type of Medication Issue Identified Description of Issue Recommendation(s)  Drug Interaction(s) (clinically significant)     Duplicate Therapy     Allergy     No Medication Administration End Date     Incorrect Dose     Additional Drug Therapy Needed     Significant med changes from prior encounter (inform family/care partners about these prior to discharge).    Other       Clinically significant medication issues were identified that warrant physician communication and completion of prescribed/recommended actions by midnight of the next day:  No  Pharmacist comments:   Time spent performing this drug regimen review (minutes): 20 minutes  Thank you Okey Regal, PharmD

## 2022-10-19 NOTE — Progress Notes (Signed)
Inpatient Rehabilitation Admissions Coordinator   I have insurance approval and CIR bed to admit him to today. I met at bedside with patient and his Dad. We reviewed cost of care. They are in agreement. I have alerted acute team and TOC and will make the arrangements to admit today.  Ottie Glazier, RN, MSN Rehab Admissions Coordinator [phone] 10/19/2022 10:16 AM

## 2022-10-19 NOTE — TOC Transition Note (Signed)
Transition of Care [facility]) - CM/SW Discharge Note   Patient Details  Name: Alexander Mcgrath MRN: [id number] Date of Birth: [birth year]  [facility] CM/SW Contact:  Kermit Balo, RN Phone Number: 10/19/2022, 1:08 PM   Clinical Narrative:    Pt is discharging to CIR today. CM signing off.    Final next level of care: IP Rehab Facility Barriers to Discharge: No Barriers Identified   Patient Goals and CMS Choice CMS Medicare.gov Compare Post Acute Care list provided to:: Patient Choice offered to / list presented to : Patient  Discharge Placement                         Discharge Plan and Services Additional resources added to the After Visit Summary for                                       Social Determinants of Health (SDOH) Interventions SDOH Screenings   Food Insecurity: No Food Insecurity (10/12/2022)  Housing: Low Risk  (10/12/2022)  Transportation Needs: No Transportation Needs (10/12/2022)  Utilities: Not At Risk (10/12/2022)  Tobacco Use: Low Risk  (10/12/2022)     Readmission Risk Interventions     No data to display

## 2022-10-19 NOTE — H&P (Signed)
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  He was seen for these diagnosis at [facility] by pediatric PM&R as well as neurology.  Per chart review patient lives alone.  1 level home with ramped entrance.  Modified independent wheelchair level.  He works as a Psychologist, occupational.  He still drives and plays adaptive basketball.  Patient was last seen by PCP 10/08/2022 with chief complaint of left hand and tongue numbness, left medial forearm numbness and was sent to the ED at [facility] for a C-spine and brain MRI showed no significant findings except mild degenerative changes of the cervical spine.  He was admitted 10/12/2022 to [facility].  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  Neurology follow-up during hospital stay placed on vitamin B12.  Neurontin ongoing for neuropathic pain.  Plans to follow-up with Dr. Nita Sickle at [facility] neurology on discharge.   Pt reports LBM was 1 weeks ago- he usually goes 1-2x/week and feels this is normal.  Has tingling discomfort in hands/palms and it' smild- doesn't think needs meds for this.  Tongue is numb and reports cannot smile.  Voids a lot.  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  York Spaniel is weak in B/L Ue's .   Review of Systems  Constitutional:  Negative for chills and fever.  HENT:  Negative for hearing loss.   Eyes:  Negative for blurred vision and double vision.  Respiratory:  Negative for cough, shortness of breath and wheezing.   Cardiovascular:   Negative for chest pain, palpitations and leg swelling.  Gastrointestinal:  Positive for constipation. Negative for heartburn, nausea and vomiting.  Genitourinary:  Negative for dysuria, flank pain and hematuria.  Musculoskeletal:  Positive for falls, joint pain and myalgias.  Skin:  Negative for rash.  Neurological:  Positive for sensory change.       Paraparesis  All other systems reviewed and are negative.  Past Medical History:  Diagnosis Date   Polio    TB of vertebral column    Past Surgical History:  Procedure Laterality Date   ORIF FEMUR FRACTURE     SPINAL FUSION     TIBIA IM NAIL INSERTION Left    History reviewed. No pertinent family history. Social History:  reports that he has never smoked. He has never used smokeless tobacco. No history on file for alcohol use and drug use. Allergies: No Known Allergies Medications Prior to Admission  Medication Sig Dispense Refill   ibuprofen (ADVIL) 200 MG tablet Take 400 mg by mouth every 6 (six) hours as needed for headache or moderate pain.        Home: Home Living Family/patient expects to be discharged to:: Private residence Living Arrangements: Alone Available Help at Discharge: Family, Available 24 hours/day Type of Home: House Home Access: Ramped entrance Home Layout: One level Bathroom Shower/Tub: Engineer, manufacturing systems: Standard Bathroom Accessibility: Yes Home Equipment: Wheelchair - manual, Tub bench, Other (comment) (vehicle with hand controls)   Functional History: Prior Function  Prior Level of Function : Independent/Modified Independent, Working/employed, Driving Mobility Comments: Pt is mod I from w/c level, typically "throws self" into w/c that does not have brakes. reports if he falls out of the w/c, he can get himself back into the chair ADLs Comments: works as a Psychologist, occupational, drives (Comptroller), plays adaptive basketball  Functional Status:  Mobility: Bed Mobility Overal bed  mobility: Modified Independent Bed Mobility: Supine to Sit, Sit to Supine Sidelying to sit: Supervision, HOB elevated Supine to sit: Modified independent (Device/Increase time) Sit to supine: Modified independent (Device/Increase time) General bed mobility comments: uses BUE to move BLE to EOB, no physical assist needed with bed mobility Transfers Overall transfer level: Needs assistance Equipment used: Sliding board Transfers: Bed to chair/wheelchair/BSC Bed to/from chair/wheelchair/BSC transfer type:: Lateral/scoot transfer Squat pivot transfers: Mod assist  Lateral/Scoot Transfers: Min assist, +2 safety/equipment, With slide board General transfer comment: Min A  x 2 for sliding board transfer to/from w/c with close hands on assist for balance   Wheelchair Mobility Wheelchair mobility: Yes Wheelchair propulsion: Both upper extremities Wheelchair parts: Independent Distance: 250 feet Wheelchair Assistance Details (indicate cue type and reason): Pt's lightweight manual w/c does not have brakes.  ADL: ADL Overall ADL's : Needs assistance/impaired Eating/Feeding: Set up, Bed level Grooming: Contact guard assist, Sitting Upper Body Bathing: Minimal assistance, Sitting Lower Body Bathing: Sitting/lateral leans, Bed level, Minimal assistance Upper Body Dressing : Minimal assistance, Sitting Lower Body Dressing: Minimal assistance, Sitting/lateral leans, Sit to/from stand Toileting- Clothing Manipulation and Hygiene: Minimal assistance, Sitting/lateral lean General ADL Comments: Focus on trial of sliding board for safe transfers, discussion of modification of ADLs w/ pt reporting able to manage bed level with Setup assist this AM. educated on progression of UE HEP, pushing through BUE on bed + sustaining this action to maximize transfer abilities  Cognition: Cognition Overall Cognitive Status: Within Functional Limits for tasks assessed Orientation Level: Oriented  X4 Cognition Arousal: Alert Behavior During Therapy: WFL for tasks assessed/performed, Flat affect Overall Cognitive Status: Within Functional Limits for tasks assessed  Physical Exam: Blood pressure 114/70, pulse 99, temperature 97.8 F (36.6 C), temperature source Oral, resp. rate 18, height 5\' 1"  (1.549 m), weight 45.4 kg, SpO2 95%. Physical Exam Vitals and nursing note reviewed.  Constitutional:      General: He is not in acute distress.    Appearance: He is not ill-appearing.     Comments: Pt awake, alert, appropriate, sitting up in bed; father at bedside; NAD  HENT:     Head: Normocephalic and atraumatic.     Comments: No smile possible- so B/L facial droop R tongue deviation Intact to sensation on face B/L EOMI B/L    Right Ear: External ear normal.     Left Ear: External ear normal.     Nose: Nose normal. No congestion.     Mouth/Throat:     Mouth: Mucous membranes are dry.     Pharynx: Oropharynx is clear. No oropharyngeal exudate.  Eyes:     General:        Right eye: No discharge.        Left eye: No discharge.     Extraocular Movements: Extraocular movements intact.  Cardiovascular:     Rate and Rhythm: Normal rate and regular rhythm.     Heart sounds: Normal heart sounds. No murmur heard.    No gallop.  Pulmonary:     Effort: Pulmonary effort is normal. No respiratory distress.     Breath  sounds: Normal breath sounds. No wheezing, rhonchi or rales.  Abdominal:     General: There is no distension.     Palpations: Abdomen is soft.     Tenderness: There is no abdominal tenderness.     Comments: Hypoactive BS  Musculoskeletal:     Cervical back: Neck supple. No tenderness.     Comments: RUE- biceps 4+/5' Triceps 4+/5; WE 4+/5; grip 4/5 and FA 2+/5 LUE- biceps 4/5; Triceps 3+/5; WE 4-/5; Grip 3+/5 and FA 2/5 LE's-t race movement in HF/KE- on LLE- but otherwise 0/5 Muscle atrophy of B/L LE's- due to polio  Skin:    General: Skin is warm and dry.      Comments: No skin breakdown seen  Neurological:     Mental Status: He is alert.     Comments: Patient is alert.  No acute distress.  Sitting up in bed with father at bedside.  Speech is a bit dysarthric but intelligible.  Oriented x 3. Polio affecting legs and spinal TB- causing severe muscle atrophy- said sensation intact  Psychiatric:        Mood and Affect: Mood normal.        Behavior: Behavior normal.     Results for orders placed or performed during the hospital encounter of 10/12/22 (from the past 48 hour(s))  Comprehensive metabolic panel     Status: Abnormal   Collection Time: 10/18/22  1:49 AM  Result Value Ref Range   Sodium 138 135 - 145 mmol/L   Potassium 4.0 3.5 - 5.1 mmol/L   Chloride 105 98 - 111 mmol/L   CO2 26 22 - 32 mmol/L   Glucose, Bld 82 70 - 99 mg/dL    Comment: Glucose reference range applies only to samples taken after fasting for at least 8 hours.   BUN 7 6 - 20 mg/dL   Creatinine, Ser 4.54 (L) 0.61 - 1.24 mg/dL   Calcium 9.2 8.9 - 09.8 mg/dL   Total Protein 7.9 6.5 - 8.1 g/dL   Albumin 3.1 (L) 3.5 - 5.0 g/dL   AST 40 15 - 41 U/L   ALT 15 0 - 44 U/L   Alkaline Phosphatase 31 (L) 38 - 126 U/L   Total Bilirubin 0.7 0.3 - 1.2 mg/dL   GFR, Estimated >11 >91 mL/min    Comment: (NOTE) Calculated using the CKD-EPI Creatinine Equation (2021)    Anion gap 7 5 - 15    Comment: Performed at [facility], [address], [place], [place] [address]  CBC     Status: Abnormal   Collection Time: 10/18/22  1:49 AM  Result Value Ref Range   WBC 5.4 4.0 - 10.5 K/uL   RBC 4.02 (L) 4.22 - 5.81 MIL/uL   Hemoglobin 12.0 (L) 13.0 - 17.0 g/dL   HCT 56.2 (L) 13.0 - 86.5 %   MCV 89.3 80.0 - 100.0 fL   MCH 29.9 26.0 - 34.0 pg   MCHC 33.4 30.0 - 36.0 g/dL   RDW 78.4 69.6 - 29.5 %   Platelets 225 150 - 400 K/uL   nRBC 0.0 0.0 - 0.2 %    Comment: Performed at [facility], [address], [place], [place] [address]   No results found.    Blood pressure  114/70, pulse 99, temperature 97.8 F (36.6 C), temperature source Oral, resp. rate 18, height 5\' 1"  (1.549 m), weight 45.4 kg, SpO2 95%.  Medical Problem List and Plan: 1. 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  Completed IVIG.  -patient may  shower  -ELOS/Goals: 16-20 days- mod I at w/c level  Admit to CIR- discussed prognosis with pt/father 2.  Antithrombotics: -DVT/anticoagulation:  Mechanical: Antiembolism stockings, thigh (TED hose) Bilateral lower extremities  -antiplatelet therapy: N/A 3. Pain Management: Neurontin 300 mg twice daily, Advil as needed 4. Mood/Behavior/Sleep: Melatonin nightly, Atarax as needed anxiety.  Provide emotional support  -antipsychotic agents: N/A 5. Neuropsych/cognition: This patient is capable of making decisions on his own behalf. 6. Skin/Wound Care: Routine skin checks 7. Fluids/Electrolytes/Nutrition: Routine in and outs with follow-up chemistries. 8. Chronic constipation- LBM 1 week ago- he doesn't think he needs assistance with this 9. W/C- is 37 years old- will see if pt wants [facility] to do new w/c eval while in-pt.  10. Nerve pain- doesn't want meds other than gabapentin 300 mg BID       Charlton Amor, PA-C 10/19/2022   I have personally performed a face to face diagnostic evaluation of this patient and formulated the key components of the plan.  Additionally, I have personally reviewed laboratory data, imaging studies, as well as relevant notes and concur with the physician assistant's documentation above.   The patient's status has not changed from the original H&P.  Any changes in documentation from the acute care chart have been noted above.

## 2022-10-19 NOTE — Progress Notes (Signed)
RT obtained NIF/VC with good pt effort.  NIF -40 VC 1.16L

## 2022-10-19 NOTE — PMR Pre-admission (Signed)
PMR Admission Coordinator Pre-Admission Assessment  Patient: Alexander Mcgrath is an 37 y.o., male MRN: [id number] DOB: 01/25/1986 Height: 5\' 1"  (154.9 cm) Weight: 45.4 kg              Insurance Information HMO:     PPO: yes     PCP:      IPA:      80/20:      OTHER:  PRIMARY: BCBS      Policy#: [id number]      Subscriber: patient CM Name: Amy      Phone#: [phone]      Fax#: [phone]/ has EMR access Pre-Cert#: [id number]   approved 8/13 until 8/26 Amy can pull form EMR   Employer:  Benefits:  Phone #: [phone]     Name:  Eff. Date: 03/08/22-03/07/2198     Deduct: $2,000 ($0 met) Out of Pocket Max: $5,000 ($0 met)      Life Max: NA CIR: 80% coverage, 20% co-insurance      SNF: 80% coverage, 20% co-insurance Outpatient: $50 co-pay/visit     Co-Pay:  Home Health: 80% coverage      Co-Pay: 20% co-insurance DME: 80% coverage     Co-Pay: 20% co-insurance Providers: in-network  SECONDARY: none      Policy#:       Phone#:   Artist:       Phone#:   The Data processing manager" for patients in Inpatient Rehabilitation Facilities with attached "Privacy Act Statement-Health Care Records" was provided and verbally reviewed with: N/A  Emergency Contact Information Contact Information     Name Relation Home Work Mobile   IVES,ANDI Mother   [phone]      Other Contacts     Name Relation Home Work Mobile   [place] Father   [phone]      Current Medical History  Patient Admitting Diagnosis: AIDP, debility  History of Present Illness: 37 year old right-handed male with 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  He was seen for these diagnosis at [facility] by pediatric PM&R as well as neurology.  Patient was last seen by PCP 10/08/2022 with chief complaint of left hand and tongue numbness, left medial forearm  numbness and was sent to the ED at [facility] for a C-spine and brain MRI showed no significant findings except mild degenerative changes of the cervical spine.  He was admitted 10/12/2022 to [facility].    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  Neurology follow-up during hospital stay placed on vitamin B12.  Neurontin ongoing for neuropathic pain.  Plans to follow-up with Dr. Nita Sickle at [facility] neurology on discharge.  Patient's medical record from [facility] has been reviewed by the rehabilitation admission coordinator and physician.  Past Medical History  Past Medical History:  Diagnosis Date   Polio    TB of vertebral column    Has the patient had major surgery during 100 days prior to admission? No  Family History  family history is not on file.   Current Medications   Current Facility-Administered Medications:    acetaminophen (TYLENOL) tablet 650 mg, 650 mg, Oral, Q6H PRN, [name], MD, 650 mg at 10/15/22 1839   cyanocobalamin (VITAMIN B12) tablet 1,000 mcg, 1,000 mcg, Oral, Daily,  Bhagat, Srishti L, MD, 1,000 mcg at 10/19/22 1016   gabapentin (NEURONTIN) capsule 300 mg, 300 mg, Oral, BID, Koomson, Julius, MD, 300 mg at 10/19/22 1016   hydrOXYzine (ATARAX) tablet 25 mg, 25 mg, Oral, TID PRN, Atway, Rayann N, DO, 25 mg at 10/17/22 2152   ibuprofen (ADVIL) tablet 600 mg, 600 mg, Oral, Q6H PRN, Masters, Katie, DO, 600 mg at 10/18/22 2107   lip balm (CARMEX) ointment, , Topical, PRN, Reymundo Poll, MD, Given at 10/16/22 2003   melatonin tablet 5 mg, 5 mg, Oral, QHS, Koomson, Lisbeth Ply, MD, 5 mg at 10/18/22 2105   multivitamin with minerals tablet 1 tablet, 1 tablet, Oral, Daily, [name], MD, 1 tablet at 10/19/22 1016   polyvinyl alcohol (LIQUIFILM TEARS) 1.4 % ophthalmic solution 1 drop, 1  drop, Both Eyes, PRN, Reymundo Poll, MD, 1 drop at 10/16/22 2005  Patients Current Diet:  Diet Order             DIET DYS 2 Room service appropriate? Yes; Fluid consistency: Thin  Diet effective now                  Precautions / Restrictions Precautions Precautions: Fall Restrictions Weight Bearing Restrictions: No   Has the patient had 2 or more falls or a fall with injury in the past year?Yes ( patient reports he falls at least monthly with no resulting injuries)  Prior Activity Level Community (5-7x/wk): works, drives, gets out of the house frequently  Prior Functional Level Prior Function Prior Level of Function : Independent/Modified Independent, Working/employed, Driving Mobility Comments: Pt is mod I from w/c level, typically "throws self" into w/c that does not have brakes. reports if he falls out of the w/c, he can get himself back into the chair ADLs Comments: works as a Psychologist, occupational, drives (Comptroller), plays adaptive basketball  Self Care: Did the patient need help bathing, dressing, using the toilet or eating?  Independent  Indoor Mobility: Did the patient need assistance with walking from room to room (with or without device)? N/a non ambulatory  Stairs: Did the patient need assistance with internal or external stairs (with or without device)? N/a  Functional Cognition: Did the patient need help planning regular tasks such as shopping or remembering to take medications? Independent  Patient Information Are you of Hispanic, Latino/a,or Spanish origin?: A. No, not of Hispanic, Latino/a, or Spanish origin What is your race?: B. Black or African American ([place]) Do you need or want an interpreter to communicate with a doctor or health care staff?: 0. No  Patient's Response To:  Health Literacy and Transportation Is the patient able to respond to health literacy and transportation needs?: Yes Health Literacy - How often do you need to have  someone help you when you read instructions, pamphlets, or other written material from your doctor or pharmacy?: Never In the past 12 months, has lack of transportation kept you from medical appointments or from getting medications?: No In the past 12 months, has lack of transportation kept you from meetings, work, or from getting things needed for daily living?: No  Journalist, newspaper / Equipment Home Assistive Devices/Equipment: Wheelchair Home Equipment: Wheelchair - manual, Tub bench, Other (comment) (vehicle with hand controls)  Prior Device Use: Indicate devices/aids used by the patient prior to current illness, exacerbation or injury? Manual wheelchair  Current Functional Level Cognition  Overall Cognitive Status: Within Functional Limits for tasks assessed Orientation Level: Oriented X4    Extremity Assessment (includes  Sensation/Coordination)  Upper Extremity Assessment: Generalized weakness RUE Deficits / Details: some improvements in sensation. accurate identification of touch with eyes occluded. overall weak, 4-/5  Lower Extremity Assessment: Defer to PT evaluation    ADLs  Overall ADL's : Needs assistance/impaired Eating/Feeding: Set up, Bed level Grooming: Contact guard assist, Sitting Upper Body Bathing: Minimal assistance, Sitting Lower Body Bathing: Sitting/lateral leans, Bed level, Minimal assistance Upper Body Dressing : Minimal assistance, Sitting Lower Body Dressing: Minimal assistance, Sitting/lateral leans, Sit to/from stand Toileting- Clothing Manipulation and Hygiene: Minimal assistance, Sitting/lateral lean General ADL Comments: Focus on trial of sliding board for safe transfers, discussion of modification of ADLs w/ pt reporting able to manage bed level with Setup assist this AM. educated on progression of UE HEP, pushing through BUE on bed + sustaining this action to maximize transfer abilities    Mobility  Overal bed mobility: Modified  Independent Bed Mobility: Supine to Sit, Sit to Supine Sidelying to sit: Supervision, HOB elevated Supine to sit: Modified independent (Device/Increase time) Sit to supine: Modified independent (Device/Increase time) General bed mobility comments: uses BUE to move BLE to EOB, no physical assist needed with bed mobility    Transfers  Overall transfer level: Needs assistance Equipment used: Sliding board Transfers: Bed to chair/wheelchair/BSC Bed to/from chair/wheelchair/BSC transfer type:: Lateral/scoot transfer Squat pivot transfers: Mod assist  Lateral/Scoot Transfers: Min assist, +2 safety/equipment, With slide board General transfer comment: Min A  x 2 for sliding board transfer to/from w/c with close hands on assist for balance    Ambulation / Gait / Stairs / Advertising account planner mobility: Yes Wheelchair propulsion: Both upper extremities Wheelchair parts: Independent Distance: 250 feet Wheelchair Assistance Details (indicate cue type and reason): Pt's lightweight manual w/c does not have brakes.    Posture / Balance Dynamic Sitting Balance Sitting balance - Comments: LEs are not able to provide good support due to paralysis Balance Overall balance assessment: Needs assistance Sitting-balance support: Feet supported, Bilateral upper extremity supported Sitting balance-Leahy Scale: Fair Sitting balance - Comments: LEs are not able to provide good support due to paralysis    Special needs/care consideration      Previous Home Environment  Living Arrangements: Alone Available Help at Discharge: Family, Available 24 hours/day Type of Home: House Home Layout: One level Home Access: Ramped entrance Bathroom Shower/Tub: Engineer, manufacturing systems: Standard Bathroom Accessibility: Yes How Accessible: Accessible via wheelchair Home Care Services: No  Discharge Living Setting Plans for Discharge Living Setting: Patient's home Type of  Home at Discharge: House Discharge Home Layout: One level Discharge Home Access: Ramped entrance Discharge Bathroom Shower/Tub: Tub/shower unit Discharge Bathroom Toilet: Standard Discharge Bathroom Accessibility: Yes How Accessible: Accessible via wheelchair Does the patient have any problems obtaining your medications?: No  Social/Family/Support Systems Anticipated Caregiver: Zylen Hoganson (father) and other family Anticipated Caregiver's Contact Information: [place] Dec: [phone] Caregiver Availability: 24/7 Discharge Plan Discussed with Primary Caregiver: Yes Is Caregiver In Agreement with Plan?: Yes Does Caregiver/Family have Issues with Lodging/Transportation while Pt is in Rehab?: No  Goals Patient/Family Goal for Rehab: Mod I:PT/OT Expected length of stay: 14 to 16 days Pt/Family Agrees to Admission and willing to participate: Yes Program Orientation Provided & Reviewed with Pt/Caregiver Including Roles  & Responsibilities: Yes  Decrease burden of Care through IP rehab admission: n/a  Possible need for SNF placement upon discharge:not anticipated  Patient Condition: This patient's condition remains as documented in the consult dated 10/18/22, in which the Rehabilitation Physician determined and documented  that the patient's condition is appropriate for intensive rehabilitative care in an inpatient rehabilitation facility. Will admit to inpatient rehab today.  Preadmission Screen Completed By:  Clois Dupes, RN MSN 10/19/2022 10:18 AM ______________________________________________________________________   Discussed status with Dr. Berline Chough on 10/19/22 at 1019 and received approval for admission today.  Admission Coordinator:  Clois Dupes RN MSN, time 1019 Date 10/19/22

## 2022-10-19 NOTE — Progress Notes (Signed)
Genice Rouge, MD  Physician Physical Medicine and Rehabilitation   PMR Pre-admission    Signed   Date of Service: 10/19/2022  8:30 AM  Related encounter: ED to Hosp-Admission (Discharged) from 10/12/2022 in [facility]   Signed     Expand All Collapse All  Show:Clear all [x] Written[x] Templated[x] Copied  Added by: [x] Standley Brooking, RN  [] Hover for details PMR Admission Coordinator Pre-Admission Assessment   Patient: Alexander Mcgrath is an 37 y.o., male MRN: [id number] DOB: Feb 27, 1986 Height: 5\' 1"  (154.9 cm) Weight: 45.4 kg                                                                                                                                                  Insurance Information HMO:     PPO: yes     PCP:      IPA:      80/20:      OTHER:  PRIMARY: BCBS      Policy#: [id number]      Subscriber: patient CM Name: Amy      Phone#: [phone]      Fax#: [phone]/ has EMR access Pre-Cert#: [id number]   approved 8/13 until 8/26 Amy can pull form EMR   Employer:  Benefits:  Phone #: [phone]     Name:  Eff. Date: 03/08/22-03/07/2198     Deduct: $2,000 ($0 met) Out of Pocket Max: $5,000 ($0 met)      Life Max: NA CIR: 80% coverage, 20% co-insurance      SNF: 80% coverage, 20% co-insurance Outpatient: $50 co-pay/visit     Co-Pay:  Home Health: 80% coverage      Co-Pay: 20% co-insurance DME: 80% coverage     Co-Pay: 20% co-insurance Providers: in-network  SECONDARY: none      Policy#:       Phone#:    Artist:       Phone#:    The Data processing manager" for patients in Inpatient Rehabilitation Facilities with attached "Privacy Act Statement-Health Care Records" was provided and verbally reviewed with: N/A   Emergency Contact Information Contact Information       Name Relation Home Work Mobile    IVES,ANDI Mother     [phone]         Other Contacts       Name Relation Home Work Mobile     [place] Father     [phone]         Current Medical History  Patient Admitting Diagnosis: AIDP, debility   History of Present Illness: 37 year old right-handed male with 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  He was seen for these diagnosis at [facility] by pediatric PM&R as well as neurology.  Patient was  last seen by PCP 10/08/2022 with chief complaint of left hand and tongue numbness, left medial forearm numbness and was sent to the ED at [facility] for a C-spine and brain MRI showed no significant findings except mild degenerative changes of the cervical spine.  He was admitted 10/12/2022 to [facility].     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  Neurology follow-up during hospital stay placed on vitamin B12.  Neurontin ongoing for neuropathic pain.  Plans to follow-up with Dr. Nita Sickle at [facility] neurology on discharge.   Patient's medical record from [facility] has been reviewed by the rehabilitation admission coordinator and physician.   Past Medical History      Past Medical History:  Diagnosis Date   Polio     TB of vertebral column          Has the patient had major surgery during 100 days prior to admission? No   Family History  family history is not on file.     Current Medications   Current Medications    Current Facility-Administered Medications:    acetaminophen (TYLENOL) tablet 650 mg, 650 mg, Oral, Q6H PRN, [name], MD, 650 mg at 10/15/22 1839   cyanocobalamin (VITAMIN B12) tablet 1,000 mcg, 1,000 mcg, Oral, Daily, Bhagat, Srishti L, MD, 1,000 mcg at 10/19/22 1016   gabapentin (NEURONTIN) capsule 300 mg, 300 mg, Oral, BID, Koomson, Julius, MD,  300 mg at 10/19/22 1016   hydrOXYzine (ATARAX) tablet 25 mg, 25 mg, Oral, TID PRN, Atway, Rayann N, DO, 25 mg at 10/17/22 2152   ibuprofen (ADVIL) tablet 600 mg, 600 mg, Oral, Q6H PRN, Masters, Katie, DO, 600 mg at 10/18/22 2107   lip balm (CARMEX) ointment, , Topical, PRN, Reymundo Poll, MD, Given at 10/16/22 2003   melatonin tablet 5 mg, 5 mg, Oral, QHS, Koomson, Julius, MD, 5 mg at 10/18/22 2105   multivitamin with minerals tablet 1 tablet, 1 tablet, Oral, Daily, [name], MD, 1 tablet at 10/19/22 1016   polyvinyl alcohol (LIQUIFILM TEARS) 1.4 % ophthalmic solution 1 drop, 1 drop, Both Eyes, PRN, Reymundo Poll, MD, 1 drop at 10/16/22 2005     Patients Current Diet:  Diet Order                  DIET DYS 2 Room service appropriate? Yes; Fluid consistency: Thin  Diet effective now                       Precautions / Restrictions Precautions Precautions: Fall Restrictions Weight Bearing Restrictions: No    Has the patient had 2 or more falls or a fall with injury in the past year?Yes ( patient reports he falls at least monthly with no resulting injuries)   Prior Activity Level Community (5-7x/wk): works, drives, gets out of the house frequently   Prior Functional Level Prior Function Prior Level of Function : Independent/Modified Independent, Working/employed, Driving Mobility Comments: Pt is mod I from w/c level, typically "throws self" into w/c that does not have brakes. reports if he falls out of the w/c, he can get himself back into the chair ADLs Comments: works as a Psychologist, occupational, drives (Comptroller), plays adaptive basketball   Self Care: Did the patient need help bathing, dressing, using the toilet or eating?  Independent   Indoor Mobility:  Did the patient need assistance with walking from room to room (with or without device)? N/a non ambulatory   Stairs: Did the patient need assistance with internal or external stairs (with or without device)?  N/a   Functional Cognition: Did the patient need help planning regular tasks such as shopping or remembering to take medications? Independent   Patient Information Are you of Hispanic, Latino/a,or Spanish origin?: A. No, not of Hispanic, Latino/a, or Spanish origin What is your race?: B. Black or African American ([place]) Do you need or want an interpreter to communicate with a doctor or health care staff?: 0. No   Patient's Response To:  Health Literacy and Transportation Is the patient able to respond to health literacy and transportation needs?: Yes Health Literacy - How often do you need to have someone help you when you read instructions, pamphlets, or other written material from your doctor or pharmacy?: Never In the past 12 months, has lack of transportation kept you from medical appointments or from getting medications?: No In the past 12 months, has lack of transportation kept you from meetings, work, or from getting things needed for daily living?: No   Journalist, newspaper / Equipment Home Assistive Devices/Equipment: Wheelchair Home Equipment: Wheelchair - manual, Tub bench, Other (comment) (vehicle with hand controls)   Prior Device Use: Indicate devices/aids used by the patient prior to current illness, exacerbation or injury? Manual wheelchair   Current Functional Level Cognition   Overall Cognitive Status: Within Functional Limits for tasks assessed Orientation Level: Oriented X4    Extremity Assessment (includes Sensation/Coordination)   Upper Extremity Assessment: Generalized weakness RUE Deficits / Details: some improvements in sensation. accurate identification of touch with eyes occluded. overall weak, 4-/5  Lower Extremity Assessment: Defer to PT evaluation     ADLs   Overall ADL's : Needs assistance/impaired Eating/Feeding: Set up, Bed level Grooming: Contact guard assist, Sitting Upper Body Bathing: Minimal assistance, Sitting Lower Body Bathing:  Sitting/lateral leans, Bed level, Minimal assistance Upper Body Dressing : Minimal assistance, Sitting Lower Body Dressing: Minimal assistance, Sitting/lateral leans, Sit to/from stand Toileting- Clothing Manipulation and Hygiene: Minimal assistance, Sitting/lateral lean General ADL Comments: Focus on trial of sliding board for safe transfers, discussion of modification of ADLs w/ pt reporting able to manage bed level with Setup assist this AM. educated on progression of UE HEP, pushing through BUE on bed + sustaining this action to maximize transfer abilities     Mobility   Overal bed mobility: Modified Independent Bed Mobility: Supine to Sit, Sit to Supine Sidelying to sit: Supervision, HOB elevated Supine to sit: Modified independent (Device/Increase time) Sit to supine: Modified independent (Device/Increase time) General bed mobility comments: uses BUE to move BLE to EOB, no physical assist needed with bed mobility     Transfers   Overall transfer level: Needs assistance Equipment used: Sliding board Transfers: Bed to chair/wheelchair/BSC Bed to/from chair/wheelchair/BSC transfer type:: Lateral/scoot transfer Squat pivot transfers: Mod assist  Lateral/Scoot Transfers: Min assist, +2 safety/equipment, With slide board General transfer comment: Min A  x 2 for sliding board transfer to/from w/c with close hands on assist for balance     Ambulation / Gait / Stairs / Theatre manager mobility: Yes Wheelchair propulsion: Both upper extremities Wheelchair parts: Independent Distance: 250 feet Wheelchair Assistance Details (indicate cue type and reason): Pt's lightweight manual w/c does not have brakes.     Posture / Balance Dynamic Sitting Balance Sitting balance - Comments: LEs are  not able to provide good support due to paralysis Balance Overall balance assessment: Needs assistance Sitting-balance support: Feet supported, Bilateral upper extremity  supported Sitting balance-Leahy Scale: Fair Sitting balance - Comments: LEs are not able to provide good support due to paralysis     Special needs/care consideration          Previous Home Environment  Living Arrangements: Alone Available Help at Discharge: Family, Available 24 hours/day Type of Home: House Home Layout: One level Home Access: Ramped entrance Bathroom Shower/Tub: Engineer, manufacturing systems: Standard Bathroom Accessibility: Yes How Accessible: Accessible via wheelchair Home Care Services: No   Discharge Living Setting Plans for Discharge Living Setting: Patient's home Type of Home at Discharge: House Discharge Home Layout: One level Discharge Home Access: Ramped entrance Discharge Bathroom Shower/Tub: Tub/shower unit Discharge Bathroom Toilet: Standard Discharge Bathroom Accessibility: Yes How Accessible: Accessible via wheelchair Does the patient have any problems obtaining your medications?: No   Social/Family/Support Systems Anticipated Caregiver: Auden Coffing (father) and other family Anticipated Caregiver's Contact Information: [place] Dec: [phone] Caregiver Availability: 24/7 Discharge Plan Discussed with Primary Caregiver: Yes Is Caregiver In Agreement with Plan?: Yes Does Caregiver/Family have Issues with Lodging/Transportation while Pt is in Rehab?: No   Goals Patient/Family Goal for Rehab: Mod I:PT/OT Expected length of stay: 14 to 16 days Pt/Family Agrees to Admission and willing to participate: Yes Program Orientation Provided & Reviewed with Pt/Caregiver Including Roles  & Responsibilities: Yes   Decrease burden of Care through IP rehab admission: n/a   Possible need for SNF placement upon discharge:not anticipated   Patient Condition: This patient's condition remains as documented in the consult dated 10/18/22, in which the Rehabilitation Physician determined and documented that the patient's condition is appropriate for intensive  rehabilitative care in an inpatient rehabilitation facility. Will admit to inpatient rehab today.   Preadmission Screen Completed By:  Clois Dupes, RN MSN 10/19/2022 10:18 AM ______________________________________________________________________   Discussed status with Dr. Berline Chough on 10/19/22 at 1019 and received approval for admission today.   Admission Coordinator:  Clois Dupes RN MSN, time 1019 Date 10/19/22            Revision History

## 2022-10-20 DIAGNOSIS — G610 Guillain-Barre syndrome: Secondary | ICD-10-CM | POA: Diagnosis not present

## 2022-10-20 MED ORDER — ENSURE ENLIVE PO LIQD
237.0000 mL | Freq: Two times a day (BID) | ORAL | Status: DC
  Administered 2022-10-21 – 2022-10-24 (×5): 237 mL via ORAL

## 2022-10-20 MED ORDER — POLYETHYLENE GLYCOL 3350 17 G PO PACK
17.0000 g | PACK | Freq: Every day | ORAL | Status: DC | PRN
  Filled 2022-10-20: qty 1

## 2022-10-20 NOTE — Care Management (Signed)
[facility] Individual Statement of Services  Patient Name:  Alexander Mcgrath  Date:  10/20/2022  Welcome to the Inpatient Rehabilitation Center.  Our goal is to provide you with an individualized program based on your diagnosis and situation, designed to meet your specific needs.  With this comprehensive rehabilitation program, you will be expected to participate in at least 3 hours of rehabilitation therapies Monday-Friday, with modified therapy programming on the weekends.  Your rehabilitation program will include the following services:  Physical Therapy (PT), Occupational Therapy (OT), 24 hour per day rehabilitation nursing, Therapeutic Recreaction (TR), Psychology, Neuropsychology, Care Coordinator, Rehabilitation Medicine, Nutrition Services, Pharmacy Services, and Other  Weekly team conferences will be held on Tuesdays to discuss your progress.  Your Inpatient Rehabilitation Care Coordinator will talk with you frequently to get your input and to update you on team discussions.  Team conferences with you and your family in attendance may also be held.  Expected length of stay: 5-7 days  Overall anticipated outcome: Independent with Assistive Device  Depending on your progress and recovery, your program may change. Your Inpatient Rehabilitation Care Coordinator will coordinate services and will keep you informed of any changes. Your Inpatient Rehabilitation Care Coordinator's name and contact numbers are listed  below.  The following services may also be recommended but are not provided by the Inpatient Rehabilitation Center:  Driving Evaluations Home Health Rehabiltiation Services Outpatient Rehabilitation Services Vocational Rehabilitation   Arrangements will be made to provide these services after discharge if needed.  Arrangements include referral to agencies that provide these services.  Your insurance has been verified to be:  BCBS  Your primary doctor  is:  NO PCP listed  Pertinent information will be shared with your doctor and your insurance company.  Inpatient Rehabilitation Care Coordinator:  Susie Cassette [phone] or [phone]  Information discussed with and copy given to patient by: Gretchen Short, 10/20/2022, 3:38 PM

## 2022-10-20 NOTE — Progress Notes (Signed)
Patient ID: Alexander Mcgrath, male   DOB: [birth year], 37 y.o.   MRN: [id number]  SW met with pt and pt father in room to provide short ELOS 5-7 days. Pt father will be in for family edu tomorrow based on scheduled  therapy in place. Will be here for w/c eval tomorrow at 11am, and bringing care at 10am to practice car transfers. He will be travelling back to [place] to get pt sister and wife and drive back. SW will update both as there is more information.   Cecile Sheerer, MSW, LCSWA Office: [phone] Cell: [phone] Fax: [phone]

## 2022-10-20 NOTE — Evaluation (Signed)
Occupational Therapy Assessment and Plan  Patient Details  Name: Alexander Mcgrath MRN: [id number] Date of Birth: [birth year]  OT Diagnosis: muscle weakness (generalized) Rehab Potential: Rehab Potential (ACUTE ONLY): Good ELOS: ~ 5 days   Today's Date: 10/20/2022 OT Individual Time: 1105-1200 OT Individual Time Calculation (min): 55 min     Hospital Problem: Principal Problem:   AIDP (acute inflammatory demyelinating polyneuropathy) (HCC)   Past Medical History:  Past Medical History:  Diagnosis Date   Polio    TB of vertebral column    Past Surgical History:  Past Surgical History:  Procedure Laterality Date   ORIF FEMUR FRACTURE     SPINAL FUSION     TIBIA IM NAIL INSERTION Left     Assessment & Plan Clinical Impression: Patient is a 37 y.o. 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 He was seen for these diagnosis at [facility] by pediatric PM&R as well as neurology. Per chart review patient lives alone. 1 level home with ramped entrance. Modified independent wheelchair level. He works as a Psychologist, occupational. He still drives and plays adaptive basketball. Patient was last seen by PCP 10/08/2022 with chief complaint of left hand and tongue numbness, left medial forearm numbness and was sent to the ED at [facility] for a C-spine and brain MRI showed no significant findings except mild degenerative changes of the cervical spine. He was admitted 10/12/2022 to [facility]. 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 Neurology follow-up during hospital stay placed on vitamin B12. Neurontin ongoing for neuropathic pain. Plans to follow-up with Dr. Nita Sickle at [facility] neurolog[facility] on  discharge.   Patient transferred to CIR on 10/19/2022 .    Patient currently requires min with basic self-care skills and TRANSFERS  secondary to muscle weakness, decreased cardiorespiratoy endurance, impaired sensation, and decreased sitting balance and decreased balance strategies.  Prior to hospitalization, patient could complete ADL with modified independent .  Patient will benefit from skilled intervention to decrease level of assist with basic self-care skills and increase independence with basic self-care skills prior to discharge home with care partner.  Anticipate patient will require intermittent supervision and no further OT follow recommended.  OT - End of Session Endurance Deficit: No OT Assessment Rehab Potential (ACUTE ONLY): Good OT Patient demonstrates impairments in the following area(s): Balance;Endurance;Motor OT Basic ADL's Functional Problem(s): Bathing;Dressing;Toileting OT Transfers Functional Problem(s): Toilet;Tub/Shower OT Additional Impairment(s): None OT Plan OT Intensity: Minimum of 1-2 x/day, 45 to 90 minutes OT Frequency: 5 out of 7 days OT Duration/Estimated Length of Stay: ~ 5 days OT Treatment/Interventions: Balance/vestibular training;Disease mangement/prevention;Neuromuscular re-education;Self Care/advanced ADL retraining;Therapeutic Exercise;DME/adaptive equipment instruction;Skin care/wound managment;UE/LE Strength taining/ROM;Wheelchair propulsion/positioning;Community reintegration;Patient/family education;UE/LE Coordination activities;Discharge planning;Functional mobility training;Psychosocial support;Therapeutic Activities;Visual/perceptual remediation/compensation OT Self Feeding Anticipated Outcome(s): n/a OT Basic Self-Care Anticipated Outcome(s): mod I OT Toileting Anticipated Outcome(s): mod I OT Bathroom Transfers Anticipated Outcome(s): mod I OT Recommendation Patient destination: Home Follow Up Recommendations: None Equipment Recommended:  None recommended by OT   OT Evaluation Precautions/Restrictions  Precautions Precautions: Fall Restrictions Weight Bearing Restrictions: Yes Other Position/Activity Restrictions: no use of lower LEs General Chart Reviewed: Yes Family/Caregiver Present: Yes Vital Signs   Pain   Home Living/Prior Functioning Home Living Available Help at Discharge: Family,  Available 24 hours/day Type of Home: House Home Access: Ramped entrance Home Layout: One level Bathroom Shower/Tub: Armed forces operational officer Accessibility: Yes Additional Comments: mod-I at wheelchair level  Lives With: Alone Prior Function Level of Independence: Independent with transfers, Independent with homemaking with wheelchair Driving: Yes (with hand controls) Vocation: Full time employment Vocation Requirements: welder Vision Baseline Vision/History: 1 Wears glasses Ability to See in Adequate Light: 0 Adequate Patient Visual Report: No change from baseline Vision Assessment?: No apparent visual deficits Perception  Perception: Within Functional Limits Praxis Praxis: WFL Cognition Cognition Overall Cognitive Status: Within Functional Limits for tasks assessed Arousal/Alertness: Awake/alert Orientation Level: Person;Place;Situation Person: Oriented Place: Oriented Situation: Oriented Memory: Appears intact Attention: Focused;Sustained Focused Attention: Appears intact Sustained Attention: Appears intact Awareness: Appears intact Problem Solving: Appears intact Safety/Judgment: Appears intact Brief Interview for Mental Status (BIMS) Repetition of Three Words (First Attempt): 3 Temporal Orientation: Year: Correct Temporal Orientation: Month: Accurate within 5 days Temporal Orientation: Day: Correct Recall: "Sock": Yes, no cue required Recall: "Blue": Yes, no cue required Recall: "Bed": Yes, no cue required BIMS Summary Score: 15 Sensation Sensation Light Touch: Impaired  Detail Central sensation comments: reports at baseline he has decreased sensation in R LE compared to L LE; reports numbness/tingling in B hands Light Touch Impaired Details: Impaired RLE Proprioception: Appears Intact (at baseline) Coordination Gross Motor Movements are Fluid and Coordinated: No Fine Motor Movements are Fluid and Coordinated: No Coordination and Movement Description: impaired due to dynamic sitting balance Motor  Motor Motor: Paraplegia;Abnormal tone;Abnormal postural alignment and control Motor - Skilled Clinical Observations: baseline paraplegia, impaired dynamic sitting balance  Trunk/Postural Assessment  Cervical Assessment Cervical Assessment: Within Functional Limits Thoracic Assessment Thoracic Assessment: Exceptions to [facility] (L rib flairing with spine curvature) Lumbar Assessment Lumbar Assessment: Within Functional Limits Postural Control Postural Control: Deficits on evaluation Trunk Control: decreased Postural Limitations: decreased  Balance Balance Balance Assessed: Yes Static Sitting Balance Static Sitting - Balance Support: Feet supported Static Sitting - Level of Assistance: 5: Stand by assistance Dynamic Sitting Balance Dynamic Sitting - Balance Support: Feet supported Dynamic Sitting - Level of Assistance: 4: Min assist;Other (comment);5: Stand by assistance Sitting balance - Comments: LEs are not able to provide good support due to paralysis Extremity/Trunk Assessment RUE Assessment RUE Assessment: Within Functional Limits General Strength Comments: numbness in hands LUE Assessment LUE Assessment: Within Functional Limits General Strength Comments: numbness in hands  Care Tool Care Tool Self Care Eating   Eating Assist Level: Set up assist    Oral Care    Oral Care Assist Level: Set up assist    Bathing   Body parts bathed by patient: Right arm;Left arm;Chest;Abdomen;Front perineal area;Buttocks;Right upper leg;Left upper  leg;Face;Right lower leg;Left lower leg     Assist Level: Contact Guard/Touching assist    Upper Body Dressing(including orthotics)   What is the patient wearing?: Pull over shirt   Assist Level: Set up assist    Lower Body Dressing (excluding footwear)   What is the patient wearing?: Underwear/pull up;Pants Assist for lower body dressing: Minimal Assistance - Patient > 75%    Putting on/Taking off footwear   What is the patient wearing?: Shoes Assist for footwear: Set up assist       Care Tool Toileting Toileting activity   Assist for toileting: Moderate Assistance - Patient 50 - 74%     Care Tool Bed Mobility Roll left and right activity    Mod I     Sit to  lying activity  Mod I       Lying to sitting on side of bed activity  Mod I        Care Tool Transfers Sit to stand transfer    N/a     Chair/bed transfer    Contact guard      Toilet transfer   Assist Level: Minimal Assistance - Patient > 75%     Care Tool Cognition  Expression of Ideas and Wants Expression of Ideas and Wants: 4. Without difficulty (complex and basic) - expresses complex messages without difficulty and with speech that is clear and easy to understand  Understanding Verbal and Non-Verbal Content Understanding Verbal and Non-Verbal Content: 4. Understands (complex and basic) - clear comprehension without cues or repetitions   Memory/Recall Ability     Refer to Care Plan for Long Term Goals  SHORT TERM GOAL WEEK 1 OT Short Term Goal 1 (Week 1): STG=LTG  Recommendations for other services: None    Skilled Therapeutic Intervention 1:1 OT eval initiated with OT purpose, role and goals discussed. Pt received in bed. Pt demonstrated squat pivot transfers throughout session with contact guard to supervision with therapist holding the wheelchair (since it has no brakes). Pt reports complaints of numbness in bilateral hands but strength is ok. Pt transferred to tub bench and showered with  contact guard. Pt did report that when he leans his head to the right he gets a headache. Pt able to don shirt with setup. Pt reported needed A to pull up pants; pt performed chair push up and therapist assisted with pulling up underwear and pants. Pt propelled to the gym and transferred to the mat table with contact guard. Focus on sitting balance while bouncing a .3lb and 2 lb ball to rebounder for 1 min each time maintaining balance. Pt then performed functional sitting balance while bouncing a basketball from the gym back to his room. Left in bed resting with call bell.    ADL ADL Eating: Set up Grooming: Setup Upper Body Bathing: Contact guard Where Assessed-Upper Body Bathing: Shower Lower Body Bathing: Contact guard Where Assessed-Lower Body Bathing: Shower Upper Body Dressing: Setup Where Assessed-Upper Body Dressing: Wheelchair Lower Body Dressing: Minimal assistance Where Assessed-Lower Body Dressing: Wheelchair Toileting: Maximal assistance Toilet Transfer: Administrator, arts: Administrator, arts Method: Administrator: Transfer tub bench Mobility  Bed Mobility Bed Mobility: Supine to Sit;Sit to Supine Supine to Sit: Supervision/Verbal cueing Sit to Supine: Supervision/Verbal cueing   Discharge Criteria: Patient will be discharged from OT if patient refuses treatment 3 consecutive times without medical reason, if treatment goals not met, if there is a change in medical status, if patient makes no progress towards goals or if patient is discharged from hospital.  The above assessment, treatment plan, treatment alternatives and goals were discussed and mutually agreed upon: by patient  Adan Sis 10/20/2022, 2:59 PM

## 2022-10-20 NOTE — Discharge Instructions (Addendum)
Inpatient Rehab Discharge Instructions  Alexander Mcgrath Discharge date and time: No discharge date for patient encounter.   Activities/Precautions/ Functional Status: Activity: activity as tolerated Diet: Soft Wound Care: Routine skin checks Functional status:  ___ No restrictions     ___ Walk up steps independently ___ 24/7 supervision/assistance   ___ Walk up steps with assistance ___ Intermittent supervision/assistance  ___ Bathe/dress independently ___ Walk with walker     _x__ Bathe/dress with assistance ___ Walk Independently    ___ Shower independently ___ Walk with assistance    ___ Shower with assistance ___ No alcohol     ___ Return to work/school ________  Special Instructions: No driving smoking or alcohol    COMMUNITY REFERRALS UPON DISCHARGE:    Outpatient: PT     OT                 Agency: [facility]- [place] location        Phone: [phone]             Appointment Date/Time: *Please expect follow-up within 7-10 business days to schedule your appointment. If you have not received follow-up, be sure to contact the site directly.*   My questions have been answered and I understand these instructions. I will adhere to these goals and the provided educational materials after my discharge from the hospital.  Patient/Caregiver Signature _______________________________ Date __________  Clinician Signature _______________________________________ Date __________  Please bring this form and your medication list with you to all your follow-up doctor's appointments.

## 2022-10-20 NOTE — Progress Notes (Signed)
NIF- >-60cmH2O x 3 attempts VC- 1.5/1.4/1.3L  (3 attempts) Pt gave great effort.

## 2022-10-20 NOTE — Progress Notes (Signed)
PROGRESS NOTE   Subjective/Complaints:  Pt reports tried to have BM last evening- wasn't able to- plans on trying again today.  Coffee usually helps.  Tingling about the same.   LBM 8 days ago.   ROS Pt denies SOB, abd pain, CP, N/V/ (+)C/D, and vision changes   Objective:   No results found. Recent Labs    10/18/22 0149 10/20/22 0719  WBC 5.4 6.2  HGB 12.0* 13.8  HCT 35.9* 41.2  PLT 225 247   Recent Labs    10/18/22 0149 10/20/22 0719  NA 138 134*  K 4.0 4.0  CL 105 98  CO2 26 26  GLUCOSE 82 107*  BUN 7 12  CREATININE 0.54* 0.54*  CALCIUM 9.2 9.7    Intake/Output Summary (Last 24 hours) at 10/20/2022 1429 Last data filed at 10/20/2022 0756 Gross per 24 hour  Intake 480 ml  Output 300 ml  Net 180 ml        Physical Exam: Vital Signs Blood pressure 125/77, pulse 93, temperature 98 F (36.7 C), resp. rate 18, height 5\' 1"  (1.549 m), weight 37.1 kg, SpO2 100%.   General: awake, alert, appropriate, sitting up some in bed; NAD HENT: conjugate gaze; oropharynx moist CV: regular rate and rhythm; no JVD Pulmonary: CTA B/L; no W/R/R- good air movement GI: soft, NT; not distended; hypoactive BS Psychiatric: appropriate- flat affect Neurological: Ox3 Musculoskeletal:     Cervical back: Neck supple. No tenderness.     Comments: RUE- biceps 4+/5' Triceps 4+/5; WE 4+/5; grip 4/5 and FA 2+/5 LUE- biceps 4/5; Triceps 3+/5; WE 4-/5; Grip 3+/5 and FA 2/5 LE's-t race movement in HF/KE- on LLE- but otherwise 0/5 Muscle atrophy of B/L LE's- due to polio  Skin:    General: Skin is warm and dry.     Comments: No skin breakdown seen  Neurological:     Mental Status: He is alert.     Comments: Patient is alert.  No acute distress.  Sitting up in bed with father at bedside.  Speech is a bit dysarthric but intelligible.  Oriented x 3. Polio affecting legs and spinal TB- causing severe muscle atrophy- said  sensation intact   Assessment/Plan: 1. Functional deficits which require 3+ hours per day of interdisciplinary therapy in a comprehensive inpatient rehab setting. Physiatrist is providing close team supervision and 24 hour management of active medical problems listed below. Physiatrist and rehab team continue to assess barriers to discharge/monitor patient progress toward functional and medical goals  Care Tool:  Bathing              Bathing assist       Upper Body Dressing/Undressing Upper body dressing        Upper body assist      Lower Body Dressing/Undressing Lower body dressing            Lower body assist       Toileting Toileting    Toileting assist       Transfers Chair/bed transfer  Transfers assist     Chair/bed transfer assist level: Minimal Assistance - Patient > 75%     Locomotion Ambulation   Ambulation assist  Ambulation activity did not occur: N/A          Walk 10 feet activity   Assist  Walk 10 feet activity did not occur: N/A        Walk 50 feet activity   Assist Walk 50 feet with 2 turns activity did not occur: N/A         Walk 150 feet activity   Assist Walk 150 feet activity did not occur: N/A         Walk 10 feet on uneven surface  activity   Assist Walk 10 feet on uneven surfaces activity did not occur: N/A         Wheelchair     Assist Is the patient using a wheelchair?: Yes Type of Wheelchair: Manual (pt's personal rigid frame wheelchair)    Wheelchair assist level: Independent Max wheelchair distance: 384ft    Wheelchair 50 feet with 2 turns activity    Assist        Assist Level: Independent   Wheelchair 150 feet activity     Assist      Assist Level: Independent   Blood pressure 125/77, pulse 93, temperature 98 F (36.7 C), resp. rate 18, height 5\' 1"  (1.549 m), weight 37.1 kg, SpO2 100%.  Medical Problem List and Plan: 1. Functional deficits  secondary to presumed AIDP/bilateral weakness with history of polio and spinal TB.  Completed IVIG.             -patient may  shower             -ELOS/Goals: 16-20 days- mod I at w/c level             Admit to CIR- discussed prognosis with pt/father  First day of evaluations -con't CIR PT and OT 2.  Antithrombotics: -DVT/anticoagulation:  Mechanical: Antiembolism stockings, thigh (TED hose)  Bilateral lower extremities  8/d/w pt- Lovneox- will do in AM             -antiplatelet therapy: N/A 3. Pain Management: Neurontin 300 mg twice daily, Advil as needed 4. Mood/Behavior/Sleep: Melatonin nightly, Atarax as needed anxiety.  Provide emotional support             -antipsychotic agents: N/A 5. Neuropsych/cognition: This patient is capable of making decisions on his own behalf. 6. Skin/Wound Care: Routine skin checks 7. Fluids/Electrolytes/Nutrition: Routine in and outs with follow-up chemistries. 8. Chronic constipation- LBM 1 week ago- he doesn't think he needs assistance with this  8/14- will order miralax prn if pt needs it- if no BM in next 1-2 days, will need sorbitol- educated pt that AIDP causes slowing of gut as well as weakness-causing.  9. W/C- is 37 years old- will see if pt wants [facility] to do new w/c eval while in-pt.  10. Nerve pain- doesn't want meds other than gabapentin 300 mg BID       I spent a total of 36   minutes on total care today- >50% coordination of care- due to  Educated pt on Gut slowing as well as nerve discomfort- also d/w team about possible new w/c eval.         LOS: 1 days A FACE TO FACE EVALUATION WAS PERFORMED  Jennings Corado 10/20/2022, 2:29 PM

## 2022-10-20 NOTE — Evaluation (Signed)
Physical Therapy Assessment and Plan  Patient Details  Name: Alexander Mcgrath MRN: [id number] Date of Birth: [birth year]  PT Diagnosis: Abnormal posture, Impaired sensation, Muscle weakness, and Paraplegia Rehab Potential: Excellent ELOS: 5-7 days   Today's Date: 10/20/2022 PT Individual Time: 0932-3557 PT Individual Time Calculation (min): 71 min    Hospital Problem: Principal Problem:   AIDP (acute inflammatory demyelinating polyneuropathy) (HCC)   Past Medical History:  Past Medical History:  Diagnosis Date   Polio    TB of vertebral column    Past Surgical History:  Past Surgical History:  Procedure Laterality Date   ORIF FEMUR FRACTURE     SPINAL FUSION     TIBIA IM NAIL INSERTION Left     Assessment & Plan Clinical Impression: Patient is a 37 y.o. 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 He was seen for these diagnosis at [facility] by pediatric PM&R as well as neurology. Per chart review patient lives alone. 1 level home with ramped entrance. Modified independent wheelchair level. He works as a Psychologist, occupational. He still drives and plays adaptive basketball. Patient was last seen by PCP 10/08/2022 with chief complaint of left hand and tongue numbness, left medial forearm numbness and was sent to the ED at [facility] for a C-spine and brain MRI showed no significant findings except mild degenerative changes of the cervical spine. He was admitted 10/12/2022 to [facility]. 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 Neurology follow-up during hospital stay placed on vitamin B12. Neurontin ongoing for neuropathic pain. Plans to follow-up with Dr. Nita Sickle at [facility] neurology on  discharge. Patient transferred to CIR on 10/19/2022 .   Patient currently requires min with mobility secondary to muscle weakness and muscle paralysis, decreased cardiorespiratoy endurance, abnormal tone, and decreased sitting balance, decreased postural control, and decreased balance strategies.  Prior to hospitalization, patient was modified independent  at wheelchair level with mobility and lived with Alone in a House home.  Home access is  Ramped entrance (through garage).  Patient will benefit from skilled PT intervention to maximize safe functional mobility, minimize fall risk, and decrease caregiver burden for planned discharge home with intermittent assist.  Anticipate patient will benefit from follow up OP at discharge.  PT - End of Session Activity Tolerance: Tolerates 30+ min activity with multiple rests Endurance Deficit: No PT Assessment Rehab Potential (ACUTE/IP ONLY): Excellent PT Barriers to Discharge: Decreased caregiver support PT Patient demonstrates impairments in the following area(s): Balance;Safety;Sensory;Skin Integrity;Endurance;Motor;Nutrition;Pain PT Transfers Functional Problem(s): Bed Mobility;Bed to Chair;Car;Floor;Furniture PT Locomotion Functional Problem(s): Wheelchair Mobility PT Plan PT Intensity: Minimum of 1-2 x/day ,45 to 90 minutes PT Frequency: 5 out of 7 days PT Duration Estimated Length of Stay: 5-7 days PT Treatment/Interventions: Community education officer;Neuromuscular re-education;Psychosocial support;UE/LE Strength taining/ROM;Wheelchair propulsion/positioning;Balance/vestibular training;Discharge planning;Functional electrical stimulation;Pain management;Therapeutic Activities;UE/LE Coordination activities;Disease management/prevention;Functional mobility training;Patient/family education;Splinting/orthotics;Therapeutic Exercise PT Transfers Anticipated Outcome(s): mod-I wheelchair level PT Locomotion Anticipated  Outcome(s): N/A PT Recommendation Recommendations for Other Services: Therapeutic Recreation consult Therapeutic Recreation Interventions: Outing/community reintergration Follow Up Recommendations: Outpatient PT;24 hour supervision/assistance Patient destination: Home Equipment Recommended: Other (comment) Equipment Details: custom wheelchair eval with Stalls   PT Evaluation Precautions/Restrictions Precautions Precautions: Fall Restrictions Weight Bearing Restrictions: No Pain Pain Assessment Pain  Scale: 0-10 (denies pain, just reports paresthesias) Pain Score: 0-No pain Pain Interference Pain Interference Pain Effect on Sleep: 1. Rarely or not at all Pain Interference with Therapy Activities: 1. Rarely or not at all Pain Interference with Day-to-Day Activities: 1. Rarely or not at all Home Living/Prior Functioning Home Living Available Help at Discharge: Family;Available 24 hours/day (retired parents & sister) Type of Home: House Home Access: Ramped entrance (through garage) Home Layout: One level Additional Comments: mod-I at wheelchair level  Lives With: Alone Prior Function Level of Independence: Independent with transfers;Independent with homemaking with wheelchair Driving: Yes (with hand controls) Vocation: Full time employment Vocation Requirements: welder Vision/Perception  Vision - History Ability to See in Adequate Light: 0 Adequate Perception Perception: Within Functional Limits Praxis Praxis: WFL  Cognition Overall Cognitive Status: Within Functional Limits for tasks assessed Orientation Level: Oriented X4 Year: 2024 Month: August Day of Week: Correct Attention: Focused;Sustained Focused Attention: Appears intact Sustained Attention: Appears intact Memory: Appears intact Safety/Judgment: Appears intact Sensation Sensation Light Touch: Impaired Detail Central sensation comments: reports at baseline he has decreased sensation in R LE compared to L  LE; reports numbness/tingling in B hands Light Touch Impaired Details: Impaired RLE Hot/Cold: Not tested Proprioception: Appears Intact (at baseline) Stereognosis: Not tested Coordination Gross Motor Movements are Fluid and Coordinated: No Coordination and Movement Description: impaired due to dynamic sitting balance Motor  Motor Motor: Paraplegia;Abnormal tone;Abnormal postural alignment and control Motor - Skilled Clinical Observations: baseline paraplegia, impaired dynamic sitting balance   Trunk/Postural Assessment  Cervical Assessment Cervical Assessment: Within Functional Limits Thoracic Assessment Thoracic Assessment: Exceptions to [facility] (L rib flairing with spine curvature) Lumbar Assessment Lumbar Assessment: Within Functional Limits Postural Control Postural Control: Deficits on evaluation Trunk Control: decreased Postural Limitations: decreased  Balance Balance Balance Assessed: Yes Static Sitting Balance Static Sitting - Balance Support: Feet supported Static Sitting - Level of Assistance: 5: Stand by assistance Dynamic Sitting Balance Dynamic Sitting - Balance Support: Feet supported Dynamic Sitting - Level of Assistance: 4: Min assist;Other (comment);5: Stand by assistance (CGA) Extremity Assessment      RLE Assessment Passive Range of Motion (PROM) Comments: WFL/WNL General Strength Comments: at baseline has no movement in R LE (paraplegia) - is flaccid LLE Assessment LLE Assessment: Exceptions to [facility] Passive Range of Motion (PROM) Comments: WFL/WNL General Strength Comments: reports no change from baseline strength (paraplegia) can wiggle toes a little and has possible slight hip rotation movement but otherwise flaccid  Care Tool Care Tool Bed Mobility Roll left and right activity   Roll left and right assist level: Independent with assistive device    Sit to lying activity   Sit to lying assist level: Supervision/Verbal cueing Sit to lying assistive  device comment: bedrails  Lying to sitting on side of bed activity   Lying to sitting on side of bed assist level: the ability to move from lying on the back to sitting on the side of the bed with no back support.: Supervision/Verbal cueing Lying to sitting on side of bed assist device comment: the ability to move from lying on the back to sitting on the side of the bed with no back support.: bedrail   Care Tool Transfers Sit to stand transfer Sit to stand activity did not occur: N/A      Chair/bed transfer   Chair/bed transfer assist level: Minimal Assistance - Patient > 75%     Psychologist, clinical transfer  assist level: Moderate Assistance - Patient 50 - 74%      Care Tool Locomotion Ambulation Ambulation activity did not occur: N/A        Walk 10 feet activity Walk 10 feet activity did not occur: N/A       Walk 50 feet with 2 turns activity Walk 50 feet with 2 turns activity did not occur: N/A      Walk 150 feet activity Walk 150 feet activity did not occur: N/A      Walk 10 feet on uneven surfaces activity Walk 10 feet on uneven surfaces activity did not occur: N/A      Stairs Stair activity did not occur: N/A        Walk up/down 1 step activity Walk up/down 1 step or curb (drop down) activity did not occur: N/A      Walk up/down 4 steps activity Walk up/down 4 steps activity did not occur: N/A      Walk up/down 12 steps activity Walk up/down 12 steps activity did not occur: N/A      Pick up small objects from floor Pick up small object from the floor (from standing position) activity did not occur: N/A (would have to perform from w/c level)      Wheelchair Is the patient using a wheelchair?: Yes Type of Wheelchair: Manual (pt's personal rigid frame wheelchair)   Wheelchair assist level: Independent Max wheelchair distance: 361ft  Wheel 50 feet with 2 turns activity   Assist Level: Independent  Wheel 150 feet activity   Assist  Level: Independent    Refer to Care Plan for Long Term Goals  SHORT TERM GOAL WEEK 1 PT Short Term Goal 1 (Week 1): = to LTGs based on ELOS  Recommendations for other services: Therapeutic Recreation  Outing/community reintegration  Skilled Therapeutic Intervention Pt received supine in bed awake and agreeable to therapy session. Evaluation completed (see details above) with patient education regarding purpose of PT evaluation, PT POC and goals, therapy schedule, weekly team meetings, and other CIR information including safety plan and fall risk safety. Pt performed the below functional mobility tasks with the specified levels of skilled cuing and assistance.   Pt utilizing his personal w/c throughout session, which does not have brakes so therapist's stabilizes his w/c for safety during all transfers. Pt reports his parents have ordered him scissor brakes to put on his w/c.  Performed the following dynamic trunk control and B UE strengthening tasks sitting on EOM:  - 2kg weighted ball trunk rotation ball taps 2x10reps  - 2kg weighted ball trunk rotation with diagonals in both directions 2x10 reps each - on 2nd set pt able to move through greater ROM and with increased speed without LOB Requires close supervision and intermittent light min A for balance recovery. - green theraband (level 3 resistance) scap retractions 2x15 reps  - green theraband (level 3 resistance) shoulder external rotation 2x15reps  Pt demos ability to perform press-up sitting EOM to relieve the weight on his buttocks for at least 30 seconds.  Educated pt on need for an updated custom manual wheelchair with brakes for his safety as well as new skin protectant cushion due to the wear and tear on his over the past 12 years - email sent to [facility] to arrange and team made aware.  Educated pt on recommendation to participate in real-life car transfer training and discussed him coordinating with his family to bring a  vehicle to the hospital.  At  end of session, pt left supported upright in bed with needs in reach and bed alarm on.    Mobility Bed Mobility Bed Mobility: Supine to Sit;Sit to Supine Supine to Sit: Supervision/Verbal cueing Sit to Supine: Supervision/Verbal cueing Transfers Transfers: Lateral/Scoot Transfers (reports feeling he is at like 25% of his normal transfer ability and is now moving much slower) Lateral/Scoot Transfers: Minimal Assistance - Patient > 75% (therapist having to stabilize the WC due to his personal chair not having brakes) Transfer (Assistive device):  (using patient personal w/c) Locomotion  Gait Ambulation: No Gait Gait: No Stairs / Additional Locomotion Stairs: No Corporate treasurer: Yes Wheelchair Assistance: Independent with assistive device (using pt's personal wheelchair) Occupational hygienist: Both upper extremities Wheelchair Parts Management: Independent (with pt's personal wheelchair) Distance: 315ft+   Discharge Criteria: Patient will be discharged from PT if patient refuses treatment 3 consecutive times without medical reason, if treatment goals not met, if there is a change in medical status, if patient makes no progress towards goals or if patient is discharged from hospital.  The above assessment, treatment plan, treatment alternatives and goals were discussed and mutually agreed upon: by patient  Ginny Forth , PT, DPT, NCS, CSRS 10/20/2022, 8:31 AM

## 2022-10-20 NOTE — Plan of Care (Signed)
  Problem: RH Balance Goal: LTG: Patient will maintain dynamic sitting balance (OT) Description: LTG:  Patient will maintain dynamic sitting balance with assistance during activities of daily living (OT) Flowsheets (Taken 10/20/2022 1510) LTG: Pt will maintain dynamic sitting balance during ADLs with: Independent with assistive device   Problem: RH Bathing Goal: LTG Patient will bathe all body parts with assist levels (OT) Description: LTG: Patient will bathe all body parts with assist levels (OT) Flowsheets (Taken 10/20/2022 1510) LTG: Pt will perform bathing with assistance level/cueing: Independent with assistive device    Problem: RH Dressing Goal: LTG Patient will perform upper body dressing (OT) Description: LTG Patient will perform upper body dressing with assist, with/without cues (OT). Flowsheets (Taken 10/20/2022 1510) LTG: Pt will perform upper body dressing with assistance level of: Independent Goal: LTG Patient will perform lower body dressing w/assist (OT) Description: LTG: Patient will perform lower body dressing with assist, with/without cues in positioning using equipment (OT) Flowsheets (Taken 10/20/2022 1510) LTG: Pt will perform lower body dressing with assistance level of: Independent with assistive device   Problem: RH Toileting Goal: LTG Patient will perform toileting task (3/3 steps) with assistance level (OT) Description: LTG: Patient will perform toileting task (3/3 steps) with assistance level (OT)  Flowsheets (Taken 10/20/2022 1510) LTG: Pt will perform toileting task (3/3 steps) with assistance level: Independent with assistive device   Problem: RH Tub/Shower Transfers Goal: LTG Patient will perform tub/shower transfers w/assist (OT) Description: LTG: Patient will perform tub/shower transfers with assist, with/without cues using equipment (OT) Flowsheets (Taken 10/20/2022 1510) LTG: Pt will perform tub/shower stall transfers with assistance level of: Independent  with assistive device

## 2022-10-20 NOTE — Progress Notes (Signed)
Physical Therapy Session Note  Patient Details  Name: Alexander Mcgrath MRN: [id number] Date of Birth: 1985-10-28  Today's Date: 10/20/2022 PT Individual Time: 1405-1508 PT Individual Time Calculation (min): 63 min   Short Term Goals: Week 1:  PT Short Term Goal 1 (Week 1): = to LTGs based on ELOS  Skilled Therapeutic Interventions/Progress Updates: Pt presented in bed with father present agreeable to therapy. Pt denies pain during session but indicates decreased sensation in hands. Spent time setting up time for car transfer (set up for tomorrow). Pt's father also inquired about new brakes for current w/c (came in mail and box was in room). Attempted to set up brakes but unable to fit appropriately (advised will discuss with Barbara Cower from [facility] during w/c eval). Pt donned shoes independently and completed transfer via lateral scoot with CGA. Pt then propelled to main gym independently and completed lateral scoot transfer to mat with light CGA. Pt then worked on sitting balance activities. Pt used rebounder with 1.5 Kg weighted ball 2 x 20 with x 2 LOB which pt was able to catch himself each time (PTA guarding). Pt then worked on dynamic reaching and hand dexterity performing forward reaching and picking up squigz from table tray with moderate reaching. Pt noted to demonstrate compensatory methods (arching, twisting) to maintain balance. Pt then performed dynamic reaching task picking up and placing clothespins on basketball net both left and R. Pt noted to have more fluid movement when performing lateral reaching removing clothespins vs placing them. Pt with mild fatigue after activities and required light minA to complete clearance when completing transfer back to w/c. Pt able to propel back to room independently and required CGA to complete transfer back to bed. Pt left in bed at end of session with call bell within reach and needs met.    Therapy Documentation Precautions:   Precautions Precautions: Fall Restrictions Weight Bearing Restrictions: Yes Other Position/Activity Restrictions: no use of lower LEs General:   Vital Signs: Therapy Vitals Temp: 99.5 F (37.5 C) Temp Source: Oral Pulse Rate: 92 Resp: 16 BP: 118/85 Patient Position (if appropriate): Lying Oxygen Therapy SpO2: 100 % Pain:   Mobility: Bed Mobility Bed Mobility: Supine to Sit;Sit to Supine Supine to Sit: Supervision/Verbal cueing Sit to Supine: Supervision/Verbal cueing Transfers Transfers: Lateral/Scoot Transfers Lateral/Scoot Transfers: Minimal Assistance - Patient > 75% Locomotion :    Trunk/Postural Assessment : Cervical Assessment Cervical Assessment: Within Functional Limits Lumbar Assessment Lumbar Assessment: Within Functional Limits Postural Control Trunk Control: decreased Postural Limitations: decreased  Balance: Balance Balance Assessed: Yes Static Sitting Balance Static Sitting - Level of Assistance: 5: Stand by assistance Dynamic Sitting Balance Dynamic Sitting - Balance Support: Feet supported Dynamic Sitting - Level of Assistance: 4: Min assist;Other (comment);5: Stand by assistance Sitting balance - Comments: LEs are not able to provide good support due to paralysis Exercises:   Other Treatments:      Therapy/Group: Individual Therapy  Jolee Critcher 10/20/2022, 4:25 PM

## 2022-10-20 NOTE — Progress Notes (Signed)
Inpatient Rehabilitation Care Coordinator Assessment and Plan Patient Details  Name: Alexander Mcgrath MRN: [id number] Date of Birth: [birth year]  Today's Date: 10/20/2022  Hospital Problems: Principal Problem:   AIDP (acute inflammatory demyelinating polyneuropathy) (HCC)  Past Medical History:  Past Medical History:  Diagnosis Date   Polio    TB of vertebral column    Past Surgical History:  Past Surgical History:  Procedure Laterality Date   ORIF FEMUR FRACTURE     SPINAL FUSION     TIBIA IM NAIL INSERTION Left    Social History:  reports that he has never smoked. He has never used smokeless tobacco. No history on file for alcohol use and drug use.  Family / Support Systems Marital Status: Single Spouse/Significant Other: N/A Children: No children Other Supports: None reported Anticipated Caregiver: father Ability/Limitations of Caregiver: PRN support from father as he works. Plan is for him to be at his father's home for a week,a nd then return to his home. 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 Job does not offer FMLA or STD Marine scientist Issues: Denies Guardian/Conservator: N/A   Abuse/Neglect Abuse/Neglect Assessment Can Be Completed: Yes Physical Abuse: Denies Verbal Abuse: Denies Sexual Abuse: Denies Exploitation of patient/patient's resources: Denies Self-Neglect: Denies  Patient response to: Social Isolation -  How often do you feel lonely or isolated from those around you?: Never  Emotional Status Pt's affect, behavior and adjustment status: Pt in good spirits at time of visit Recent Psychosocial Issues: Pt is not happy about being here but remains positive about his future Psychiatric History: No psych hx. See above. Substance Abuse History: Denies tobacco product use. Occassional etoh use. Denies rec drug use.  Patient / Family Perceptions, Expectations & Goals Pt/Family understanding of illness & functional limitations: Pt and family have a general understanding of pt care needs Premorbid pt/family roles/activities: Independent Anticipated changes in roles/activities/participation: independent at w/c level? Pt/family expectations/goals: Pt goal is to wlrk on balance and transfers since he knows this is 50% of his healing.  Community Resources [employer]: None Premorbid Home Care/DME Agencies: None Transportation available at discharge: TBD Is the patient able to respond to transportation needs?: Yes In the past 12 months, has lack of transportation kept you from medical appointments or from getting medications?: No In the past 12 months, has lack of transportation kept you from meetings, work, or from getting things needed for daily living?: No Resource referrals recommended: Neuropsychology  Discharge Planning Living Arrangements: Alone Support Systems: Parent Type of Residence: Private residence Insurance Resources: Media planner (specify) Herbalist) Financial Resources: Employment Surveyor, quantity Screen Referred: No Living Expenses: Banker Management: Patient Does the patient have any problems obtaining your medications?: No Home Management: Pt manages all homecare needs Patient/Family Preliminary Plans: N/A Care Coordinator Barriers to Discharge: Decreased caregiver support, Insurance for SNF coverage, Lack of/limited family support Care Coordinator Anticipated Follow  Up Needs: HH/OP Expected length of stay: 5-7 days  Clinical Impression SW met with pt in room to introduce self, explain self, and  discuss discharge process. Pt is not a Cytogeneticist. No HCPOA. DME: shower chair and w/c. Pt aware SW will follow-up with his father Huntley Dec.   Shenise Wolgamott A Shantil Vallejo 10/20/2022, 4:02 PM

## 2022-10-20 NOTE — Progress Notes (Signed)
Initial Nutrition Assessment  DOCUMENTATION CODES:   Non-severe (moderate) malnutrition in context of chronic illness  INTERVENTION:  - Continue Dys 2 diet.   - Continue Ensure Enlive po BID, each supplement provides 350 kcal and 20 grams of protein.  NUTRITION DIAGNOSIS:   Moderate Malnutrition related to chronic illness as evidenced by moderate fat depletion, moderate muscle depletion.  GOAL:   Patient will meet greater than or equal to 90% of their needs  MONITOR:   PO intake, Supplement acceptance  REASON FOR ASSESSMENT:   Malnutrition Screening Tool    ASSESSMENT:   37 y.o. 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 PMH includes: polio, spinal TB.  Meds reviewed: Vit B12, MVI. Labs reviewed: Na low, creatinine low.   The pt reports that he has a good appetite and has been eating well. He states that he typically only eats 2 meals per day and that this is normal for him. Pt currently has Ensure BID ordered and reports that he is drinking them. He denies any wt loss. RD will continue to monitor PO intakes.  NUTRITION - FOCUSED PHYSICAL EXAM:  Flowsheet Row Most Recent Value  Orbital Region Moderate depletion  Upper Arm Region Moderate depletion  Thoracic and Lumbar Region Moderate depletion  Buccal Region Moderate depletion  Temple Region Moderate depletion  Clavicle Bone Region Moderate depletion  Clavicle and Acromion Bone Region Moderate depletion  Scapular Bone Region Unable to assess  Dorsal Hand Unable to assess  Patellar Region Unable to assess  Anterior Thigh Region Unable to assess  Posterior Calf Region Unable to assess  Edema (RD Assessment) None  Hair Reviewed  Eyes Reviewed  Mouth Reviewed  Skin Reviewed  Nails Reviewed       Diet Order:   Diet Order             DIET DYS 2 Room service appropriate? Yes; Fluid consistency: Thin  Diet effective now                    EDUCATION NEEDS:   Not appropriate for education at this time  Skin:  Skin Assessment: Reviewed RN Assessment  Last BM:  PTA  Height:   Ht Readings from Last 1 Encounters:  10/19/22 5\' 1"  (1.549 m)    Weight:   Wt Readings from Last 1 Encounters:  10/19/22 37.1 kg    Ideal Body Weight:     BMI:  Body mass index is 15.45 kg/m.  Estimated Nutritional Needs:   Kcal:  1400-1600 kcals  Protein:  70-80 gm  Fluid:  >/= 1.4 L  Bethann Humble, RD, LDN, CNSC.

## 2022-10-20 NOTE — Progress Notes (Signed)
Inpatient Rehabilitation  Patient information reviewed and entered into eRehab system by Kelly Gentry, OTR/L, Rehab Quality Coordinator.   Information including medical coding, functional ability and quality indicators will be reviewed and updated through discharge.   

## 2022-10-20 NOTE — Plan of Care (Signed)
  Problem: RH Balance Goal: LTG Patient will maintain dynamic sitting balance (PT) Description: LTG:  Patient will maintain dynamic sitting balance with assistance during mobility activities (PT) Flowsheets (Taken 10/20/2022 1218) LTG: Pt will maintain dynamic sitting balance during mobility activities with:: Independent with assistive device    Problem: RH Bed Mobility Goal: LTG Patient will perform bed mobility with assist (PT) Description: LTG: Patient will perform bed mobility with assistance, with/without cues (PT). Flowsheets (Taken 10/20/2022 1218) LTG: Pt will perform bed mobility with assistance level of: Independent with assistive device    Problem: RH Bed to Chair Transfers Goal: LTG Patient will perform bed/chair transfers w/assist (PT) Description: LTG: Patient will perform bed to chair transfers with assistance (PT). Flowsheets (Taken 10/20/2022 1218) LTG: Pt will perform Bed to Chair Transfers with assistance level: Independent with assistive device    Problem: RH Car Transfers Goal: LTG Patient will perform car transfers with assist (PT) Description: LTG: Patient will perform car transfers with assistance (PT). Flowsheets (Taken 10/20/2022 1218) LTG: Pt will perform car transfers with assist:: Supervision/Verbal cueing   Problem: RH Floor Transfers Goal: LTG Patient will perform floor transfers w/assist (PT) Description: LTG: Patient will perform floor transfers with assistance (PT). Flowsheets (Taken 10/20/2022 1218) LTG: PT WILL PERFORM FLOOR TRANFERS  WITH  ASSIST:: Supervision/Verbal cueing

## 2022-10-21 DIAGNOSIS — G610 Guillain-Barre syndrome: Secondary | ICD-10-CM | POA: Diagnosis not present

## 2022-10-21 MED ORDER — SORBITOL 70 % SOLN
30.0000 mL | Freq: Every day | Status: DC | PRN
  Administered 2022-10-21 – 2022-10-25 (×2): 30 mL via ORAL
  Filled 2022-10-21 (×2): qty 30

## 2022-10-21 NOTE — Progress Notes (Signed)
PROGRESS NOTE   Subjective/Complaints:  LBM 9 days ago.  Pt didn't respond to miralax.  Doesn't want to try Sorbitol today wants miralax to see if will work.  Had HA yesterday when tilted head to side-no HA today when attempts same thing.  No improvement in smile Arms OK, but hands still tingling and feel a little weak still.     ROS   Pt denies SOB, abd pain, CP, N/V/C/D, and vision changes  Except for HPI  Objective:   No results found. Recent Labs    10/20/22 0719  WBC 6.2  HGB 13.8  HCT 41.2  PLT 247   Recent Labs    10/20/22 0719  NA 134*  K 4.0  CL 98  CO2 26  GLUCOSE 107*  BUN 12  CREATININE 0.54*  CALCIUM 9.7    Intake/Output Summary (Last 24 hours) at 10/21/2022 0919 Last data filed at 10/21/2022 0720 Gross per 24 hour  Intake 600 ml  Output 575 ml  Net 25 ml        Physical Exam: Vital Signs Blood pressure 118/85, pulse 90, temperature 98.2 F (36.8 C), resp. rate 16, height 5\' 1"  (1.549 m), weight 37.1 kg, SpO2 100%.    General: awake, alert, appropriate, doing putty with items pulling them out; at table in gym; with OTA; OT joined [place] at well;  NAD HENT: conjugate gaze; oropharynx moist CV: regular rate and rhythm; no JVD Pulmonary: CTA B/L; no W/R/R- good air movement GI: soft, NT, ND, (+)BS Psychiatric: appropriate Neurological: Ox3 Post polio in legs-  Musculoskeletal:     Cervical back: Neck supple. No tenderness.     Comments: RUE- biceps 4+/5' Triceps 4+/5; WE 4+/5; grip 4/5 and FA 2+/5 LUE- biceps 4/5; Triceps 3+/5; WE 4-/5; Grip 3+/5 and FA 2/5 LE's-t race movement in HF/KE- on LLE- but otherwise 0/5 Muscle atrophy of B/L LE's- due to polio  Skin:    General: Skin is warm and dry.     Comments: No skin breakdown seen  Neurological:     Mental Status: He is alert.     Comments: Patient is alert.  No acute distress.  Sitting up in bed with father at bedside.  Speech  is a bit dysarthric but intelligible.  Oriented x 3. 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 Functional deficits which require 3+ hours per day of interdisciplinary therapy in a comprehensive inpatient rehab setting. Physiatrist is providing close team supervision and 24 hour management of active medical problems listed below. Physiatrist and rehab team continue to assess barriers to discharge/monitor patient progress toward functional and medical goals  Care Tool:  Bathing    Body parts bathed by patient: Right arm, Left arm, Chest, Abdomen, Front perineal area, Buttocks, Right upper leg, Left upper leg, Face, Right lower leg, Left lower leg         Bathing assist Assist Level: Supervision/Verbal cueing     Upper Body Dressing/Undressing Upper body dressing   What is the patient wearing?: Pull over shirt    Upper body assist Assist Level: Independent    Lower Body Dressing/Undressing  Lower body dressing      What is the patient wearing?: Underwear/pull up, Pants     Lower body assist Assist for lower body dressing: Contact Guard/Touching assist     Toileting Toileting    Toileting assist Assist for toileting: Moderate Assistance - Patient 50 - 74%     Transfers Chair/bed transfer  Transfers assist     Chair/bed transfer assist level: Minimal Assistance - Patient > 75%     Locomotion Ambulation   Ambulation assist   Ambulation activity did not occur: N/A          Walk 10 feet activity   Assist  Walk 10 feet activity did not occur: N/A        Walk 50 feet activity   Assist Walk 50 feet with 2 turns activity did not occur: N/A         Walk 150 feet activity   Assist Walk 150 feet activity did not occur: N/A         Walk 10 feet on uneven surface  activity   Assist Walk 10 feet on uneven surfaces activity did not occur: N/A          Wheelchair     Assist Is the patient using a wheelchair?: Yes Type of Wheelchair: Manual (pt's personal rigid frame wheelchair)    Wheelchair assist level: Independent Max wheelchair distance: 361ft    Wheelchair 50 feet with 2 turns activity    Assist        Assist Level: Independent   Wheelchair 150 feet activity     Assist      Assist Level: Independent   Blood pressure 118/85, pulse 90, temperature 98.2 F (36.8 C), resp. rate 16, height 5\' 1"  (1.549 m), weight 37.1 kg, SpO2 100%.  Medical Problem List and Plan: 1. 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  Completed IVIG.             -patient may  shower             -ELOS/Goals: 16-20 days- mod I at w/c level             Admit to CIR- discussed prognosis with pt/father  Con't CIR PT and OT  Making good gains- will be here ~ 7 days- d/c next Monday/Tuesday 2.  Antithrombotics: -DVT/anticoagulation:  Mechanical: Antiembolism stockings, thigh (TED hose)  Bilateral lower extremities  8/d/w pt- Lovneox- will do in AM             -antiplatelet therapy: N/A 3. Pain Management: Neurontin 300 mg twice daily, Advil as needed 4. Mood/Behavior/Sleep: Melatonin nightly, Atarax as needed anxiety.  Provide emotional support             -antipsychotic agents: N/A 5. Neuropsych/cognition: This patient is capable of making decisions on his own behalf. 6. Skin/Wound Care: Routine skin checks 7. Fluids/Electrolytes/Nutrition: Routine in and outs with follow-up chemistries. 8. Chronic constipation- LBM 1 week ago- he doesn't think he needs assistance with this  8/14- will order miralax prn if pt needs it- if no BM in next 1-2 days, will need sorbitol- educated pt that AIDP causes slowing of gut as well as weakness-causing. 8/15- no BM yet- wants ot wait on Sorbitol per pt-   9. W/C- is 37 years old- will see if pt wants [facility] to do new w/c eval while in-pt.    8/15- getting w/c eval today or tomorrow- pt agrees  wants ot do- his own w/c has no brakes as well.  10. Nerve pain- doesn't want meds other than gabapentin 300 mg BID        I spent a total of 56   minutes on total care today- >50% coordination of care- due to  Prolonged d/w pt about driving- he wants to go home driving- hand controls- d/w OT x2 different OT"s- and made agreement with pt that will go to parking lot and do initially and then progress slowly.   LOS: 2 days A FACE TO FACE EVALUATION WAS PERFORMED  Tashiya Souders 10/21/2022, 9:19 AM

## 2022-10-21 NOTE — Progress Notes (Signed)
Occupational Therapy Session Note  Patient Details  Name: Alexander Mcgrath MRN: [id number] Date of Birth: 14-May-1985  Today's Date: 10/21/2022 OT Individual Time: 7829-5621 OT Individual Time Calculation (min): 72 min    Short Term Goals: Week 1:  OT Short Term Goal 1 (Week 1): STG=LTG  Skilled Therapeutic Interventions/Progress Updates:    Pt resting in bed uipon arrival. Initial focus on bed mobility and bathing/dressing. All transfers with supervision with OTA stabilizing w/c (w/c currently has no brakes.) Bathing with supervision at shower level. Dressing seated on TTB with lateral leans to pull pants over hips. Pt propelled w/c to day room and issued yellow and red theraputty. Pt used yellow theraputty for strengthening and [facility] tasks. Pt issued small beads to place in yellow theraputty. Discussion with MD and OTA regarding pt driving in personal car on discharge. MD in agreement that pt can drive when certain benchmarks are achieved after discharge (practice driving with father or friends to "test" reactions under driving conditions.) Pt returned to room and remained in w/c with all needs within reach.  Therapy Documentation Precautions:  Precautions Precautions: Fall Restrictions Weight Bearing Restrictions: Yes Other Position/Activity Restrictions: no use of lower LEs  Pain:  Pt denies pain this morning but reports ongoing "tingling" and numbness is BUE/hands  Therapy/Group: Individual Therapy  Rich Brave 10/21/2022, 8:17 AM

## 2022-10-21 NOTE — Progress Notes (Signed)
Physical Therapy Session Note  Patient Details  Name: Alexander Mcgrath MRN: [id number] Date of Birth: 08-Apr-1985  Today's Date: 10/21/2022 PT Individual Time: 1100-1130 PT Individual Time Calculation (min): 30 min  and Today's Date: 10/21/2022 PT Co-Treatment Time: 1130-1145 PT Co-Treatment Time Calculation (min): 15 min  Short Term Goals: Week 1:  PT Short Term Goal 1 (Week 1): = to LTGs based on ELOS  Skilled Therapeutic Interventions/Progress Updates:    Pt recd in gym as hand off from previous session. Session focused on w/c eval with Barbara Cower, ATP,  and Tom, [place], present. Discussed pt's current needs vs 37 yo old chair. Discussed new rigid frame chair and cushion options. Plan to trial cushions for stability and skin protection. After session, returned to pt room and pt transferred to EOB with CGA to stabilize w/c, and onto cushion supervision. Pt remained seated EOB on cushion, was left with all needs in reach and alarm active.   Therapy Documentation Precautions:  Precautions Precautions: Fall Restrictions Weight Bearing Restrictions: Yes Other Position/Activity Restrictions: no use of lower LEs General:       Therapy/Group: Individual Therapy  Juluis Rainier 10/21/2022, 12:18 PM

## 2022-10-21 NOTE — Progress Notes (Signed)
Occupational Therapy Session Note  Patient Details  Name: Alexander Mcgrath MRN: [id number] Date of Birth: 08/11/1985  Today's Date: 10/21/2022 OT Co-Treatment Time: 1145-1200 Co-tx with PT 1130-1200 Total time 30 mins OT Co-Treatment Time Calculation (min): 15 min   Short Term Goals: Week 1:  OT Short Term Goal 1 (Week 1): STG=LTG  Skilled Therapeutic Interventions/Progress Updates:   Co-tx with PT for w/c eval with [facility] [employer]. Reviewed needs of pt and brakes installed on pt's personal w/c. Reviewed pt's decreased hand strength and plan going forth to improve. Discussed seating for w/c modifications. Pt returned to room and transferred to bed. All needs within reach.   Therapy Documentation Precautions:  Precautions Precautions: Fall Restrictions Weight Bearing Restrictions: Yes Other Position/Activity Restrictions: no use of lower LEs   Pain: Pt denies pain this morning but reports bil hand tingling and numbness  Therapy/Group: Individual Therapy  Rich Brave 10/21/2022, 12:07 PM

## 2022-10-21 NOTE — Progress Notes (Signed)
Physical Therapy Session Note  Patient Details  Name: Enso Yoke MRN: [id number] Date of Birth: 1985/12/30  Today's Date: 10/21/2022 PT Individual Time: 0950-1100 PT Individual Time Calculation (min): 70 min   Short Term Goals: Week 1:  PT Short Term Goal 1 (Week 1): = to LTGs based on ELOS  Skilled Therapeutic Interventions/Progress Updates: Pt presented in bed with father present agreeable to therapy. Pt denies pain. First part of session focused on car transfer to pt's own vehicle. Pt completed squat pivot transfer to w/c with supervision and PTA stabilizing w/c due to no brakes. Pt then propelled to [facility] entrance mod I. Pt was able to complete transfer to car with supervision but required significantly increased time/effort to release wheels and bring frame into vehicle. Pt also noted to require increased effort to release wheel from frame. Pt worked on problem solving how to safely position w/c to allow pt more leverage to release wheels and bring frame into vehicle. Discussed barriers during transfer (increased hand fatigue) and pt stated per discussion with MD earlier in day recommended waiting 1-2 wks post d/c to resume driving. Pt completed transfer back out of vehicle with increased time and distant supervision. Pt then propelled back to unit mod I and transferred to high/low mat with PTA stabilizing w/c only. Pt then participated in UE strengthening/balance activities including shoulder flexion focusing on lateral and posterior deltoid with 2-4lb weight 2x 10 bilaterally. Pt required intermittent guarding due to LOB however pt able to recovery without assist. Pt also performed small range chops with 1/5Kg weighted ball 2 x 10, and chest press with 1.5Kg weighted ball 2 x 10. Pt then worked on hand/fine motor activities including screwing/unscrewing bolts of varying sizes as well as sorting/organizing/flipping over plastic "bugs" as well as attempting to interlock them together. Pt then  transferred back to w/c in same manner as prior and proplled to dayroom. Pt handed off to primary PT and Jason from [place] for w/c eval with current needs met.      Therapy Documentation Precautions:  Precautions Precautions: Fall Restrictions Weight Bearing Restrictions: Yes Other Position/Activity Restrictions: no use of lower LEs General:   Vital Signs: Therapy Vitals Temp: 98.4 F (36.9 C) Resp: 16 BP: 119/78 Patient Position (if appropriate): Sitting Oxygen Therapy SpO2: 99 % O2 Device: Room Air Pain:   Mobility:   Locomotion :    Trunk/Postural Assessment :    Balance:   Exercises:   Other Treatments:      Therapy/Group: Individual Therapy  Camdon Saetern 10/21/2022, 4:21 PM

## 2022-10-22 DIAGNOSIS — G610 Guillain-Barre syndrome: Secondary | ICD-10-CM | POA: Diagnosis not present

## 2022-10-22 DIAGNOSIS — R4589 Other symptoms and signs involving emotional state: Secondary | ICD-10-CM

## 2022-10-22 MED ORDER — SORBITOL 70 % SOLN
15.0000 mL | Freq: Once | Status: DC
  Filled 2022-10-22: qty 30

## 2022-10-22 NOTE — Progress Notes (Signed)
Patient ID: Alexander Mcgrath, male   DOB: [birth year], 37 y.o.   MRN: [id number]  Medical team reports pt will d/c on Tuesday. SW waiting on confirmation of d/c needs and if pt can be Mod I at home or if he needs to go to a family member's home.    *SW met with pt in room to discuss discharge. Pt is aware. Pt states he will go to his parent's home for one week,a dn then transition home. Anticipates his father to return form [place] on Sunday. SW discussed discharge process, and will follow-up once there are recommendations.   Cecile Sheerer, MSW, LCSWA Office: [phone] Cell: [phone] Fax: [phone]

## 2022-10-22 NOTE — Consult Note (Signed)
Neuropsychological Consultation Comprehensive Inpatient Rehab   Patient:   Alexander Mcgrath   DOB:   1985-12-26  MR Number:  [id number]  Location:  [facility] [address] CENTER A [address] [place] [address] Dept: [phone] Loc: [phone]           Date of Service:   10/22/2022  Start Time:   10 AM End Time:   11 AM  Provider/Observer:  Arley Phenix, Psy.D.       Clinical Neuropsychologist       Billing Code/Service: [phone]  Reason for Service:    Alexander Mcgrath is a 37 year old male referred for neuropsychological consultation during his ongoing admission to the comprehensive inpatient rehabilitation unit.  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  Patient was born and raised until 37 years of age in [place] before coming to the [place].  Patient was diagnosed as a child at [facility].  Patient has been independent living alone and maintaining a job as a Psychologist, occupational and was active.  Patient presented to PCP describing complaints of left hand and tongue numbness, forearm numbness.  Patient sent to ED with C-spine and MRI conducted with no significant findings noted.  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  Patient will follow-up with [facility] neurology postdischarge.  Patient was awake and alert sitting in his wheelchair with no complaints of particular pain or difficulties.  Patient reports that he has noted improvements with planned discharge on Tuesday.  Patient with good cognition, oriented x 4 and good mental status.  Patient admits to being frustrated about  change in inability to function and work right now but reports mood improving with improvement in overall symptoms.  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 He was seen for these diagnosis at [facility] by pediatric PM&R as well as neurology. Per chart review patient lives alone. 1 level home with ramped entrance. Modified independent wheelchair level. He works as a Psychologist, occupational. He still drives and plays adaptive basketball. Patient was last seen by PCP 10/08/2022 with chief complaint of left hand and tongue numbness, left medial forearm numbness and was sent to the ED at [facility] for a C-spine and brain MRI showed no significant findings except mild degenerative changes of the cervical spine. He was admitted 10/12/2022 to [facility]. 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 Neurology follow-up during hospital stay placed on vitamin B12. Neurontin ongoing for neuropathic pain. Plans to follow-up with Dr. Nita Sickle at [facility] neurology on discharge.   Medical History:   Past Medical History:  Diagnosis Date   Polio    TB of vertebral column          Patient Active Problem  List   Diagnosis Date Noted   Difficulty coping 10/22/2022   AIDP (acute inflammatory demyelinating polyneuropathy) (HCC) 10/19/2022   Numbness 10/14/2022   Weakness 10/12/2022    Behavioral Observation/Mental Status:   Alexander Mcgrath  presents as a 37 y.o.-year-old Right handed African American Male who appeared his stated  age. his dress was Appropriate and he was Well Groomed and his manners were Appropriate to the situation.  his participation was indicative of Appropriate behaviors.  There were physical disabilities noted.  he displayed an appropriate level of cooperation and motivation.    Interactions:    Active Appropriate  Attention:   within normal limits and attention span and concentration were age appropriate  Memory:   within normal limits; recent and remote memory intact  Visuo-spatial:   within normal limits  Speech (Volume):  normal  Speech:   normal; normal  Thought Process:  Coherent and Relevant  Coherent, Directed, Logical, and Oriented  Though Content:  WNL; not suicidal and not homicidal  Orientation:   person, place, time/date, and situation  Judgment:   Good  Planning:   Good  Affect:    Appropriate  Mood:    Dysphoric  Insight:   Good  Intelligence:   normal  Family Med/Psych History: No family history on file.  Impression/DX:   Alexander Mcgrath is a 37 year old male referred for neuropsychological consultation during his ongoing admission to the comprehensive inpatient rehabilitation unit.  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  Patient was born and raised until 37 years of age in [place] before coming to the [place].  Patient was diagnosed as a child at [facility].  Patient has been independent living alone and maintaining a job as a Psychologist, occupational and was active.  Patient presented to PCP describing complaints of left hand and tongue numbness, forearm numbness.  Patient sent to ED with C-spine and MRI conducted with no significant findings noted.  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  Patient will follow-up with [facility] neurology postdischarge.  Patient was awake and alert sitting in his wheelchair with no complaints of particular pain or difficulties.  Patient reports that he has noted improvements with planned discharge on Tuesday.  Patient with good cognition, oriented x 4 and good mental status.  Patient admits to being frustrated about change in inability to function and work right now but reports mood improving with improvement in overall symptoms.  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  Disposition/Plan:  Today we worked on coping and adjustment issues the patient appears to be doing very well given circumstances and noting improvement in main motivation.  He admits to frustration and adjustment issues initially with concerns about what was happening and inability to know definitively causative factors.          Electronically Signed   _______________________ Arley Phenix, Psy.D. Clinical Neuropsychologist

## 2022-10-22 NOTE — Progress Notes (Signed)
Occupational Therapy Session Note  Patient Details  Name: Alexander Mcgrath MRN: [id number] Date of Birth: 28-Feb-1986  Today's Date: 10/22/2022 OT Individual Time: 1300-1345 OT Individual Time Calculation (min): 45 min    Short Term Goals: Week 1:  OT Short Term Goal 1 (Week 1): STG=LTG  Skilled Therapeutic Interventions/Progress Updates:    OT interventin with focus on w/c mobility in community setting and transfers in preparation for d/c home on 8/20. All transfers with supervision. Pt propelled w/c down to [facility] and down hill at [facility] entrance. Pt at supervision. Pt reports brake placement on w/c was a slight hindrance (as they also get in way during transfers). Pt also noted increased fatigue with propelling up hill. Pt returned to room and transferred to bed. Brakes removed from w/c per pt's request. Pt remained in bed with all needs wihtin reach.   Therapy Documentation Precautions:  Precautions Precautions: Fall Restrictions Weight Bearing Restrictions: Yes Other Position/Activity Restrictions: no use of lower LEs Pain:  Pt denies pain this afternoon  Therapy/Group: Individual Therapy  Rich Brave 10/22/2022, 2:49 PM

## 2022-10-22 NOTE — Progress Notes (Signed)
Pt states had a BM yesterday 8/15 with father in the room. States was a large BM. Declines sorbitol at this time but will take over the weekend if feels is needed.

## 2022-10-22 NOTE — Progress Notes (Signed)
With excellent effort pt did NIF -30 & VC 1.5

## 2022-10-22 NOTE — Progress Notes (Signed)
PROGRESS NOTE   Subjective/Complaints:  LBM 10 days ago- will order sorbitol 15cc- since doesn't want blow out.  Got w/c eval and got new brakes on w/c- different configuration than used to.  Willing to stay til Tuesday.    ROS   Pt denies SOB, abd pain, CP, N/V/ (+)C/D, and vision changes  Except for HPI  Objective:   No results found. Recent Labs    10/20/22 0719  WBC 6.2  HGB 13.8  HCT 41.2  PLT 247   Recent Labs    10/20/22 0719  NA 134*  K 4.0  CL 98  CO2 26  GLUCOSE 107*  BUN 12  CREATININE 0.54*  CALCIUM 9.7    Intake/Output Summary (Last 24 hours) at 10/22/2022 0753 Last data filed at 10/22/2022 0723 Gross per 24 hour  Intake 240 ml  Output 900 ml  Net -660 ml        Physical Exam: Vital Signs Blood pressure 94/69, pulse 82, temperature 97.8 F (36.6 C), temperature source Oral, resp. rate 18, height 5\' 1"  (1.549 m), weight 37.1 kg, SpO2 100%.    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 No tenderness.     Comments: RUE- biceps 4+/5' Triceps 4+/5; WE 4+/5; grip 4/5 and FA 2+/5 LUE- biceps 4/5; Triceps 3+/5; WE 4-/5; Grip 3+/5 and FA 2/5 LE's-t race movement in HF/KE- on LLE- but otherwise 0/5 Muscle atrophy of B/L LE's- due to polio  Skin:    General: Skin is warm and dry.     Comments: No skin breakdown seen  Neurological:     Mental Status: He is alert.     Comments: Patient is alert.  No acute distress.  Sitting up in bed with father at bedside.  Speech is a bit dysarthric but intelligible.  Oriented x 3. Polio affecting legs and spinal TB- causing severe muscle  atrophy- said sensation intact   Assessment/Plan: 1. Functional deficits which require 3+ hours per day of interdisciplinary therapy in a comprehensive inpatient rehab setting. Physiatrist is providing close team supervision and 24 hour management of active medical problems listed below. Physiatrist and rehab team continue to assess barriers to discharge/monitor patient progress toward functional and medical goals  Care Tool:  Bathing    Body parts bathed by patient: Right arm, Left arm, Chest, Abdomen, Front perineal area, Buttocks, Right upper leg, Left upper leg, Face, Right lower leg, Left lower leg         Bathing assist Assist Level: Supervision/Verbal cueing     Upper Body Dressing/Undressing Upper body dressing   What is the patient wearing?: Pull over shirt    Upper body assist Assist Level: Independent    Lower Body Dressing/Undressing Lower body dressing      What is the patient wearing?: Underwear/pull up, Pants     Lower body assist Assist for lower body dressing:  Contact Guard/Touching assist     Toileting Toileting    Toileting assist Assist for toileting: Moderate Assistance - Patient 50 - 74%     Transfers Chair/bed transfer  Transfers assist     Chair/bed transfer assist level: Minimal Assistance - Patient > 75%     Locomotion Ambulation   Ambulation assist   Ambulation activity did not occur: N/A          Walk 10 feet activity   Assist  Walk 10 feet activity did not occur: N/A        Walk 50 feet activity   Assist Walk 50 feet with 2 turns activity did not occur: N/A         Walk 150 feet activity   Assist Walk 150 feet activity did not occur: N/A         Walk 10 feet on uneven surface  activity   Assist Walk 10 feet on uneven surfaces activity did not occur: N/A         Wheelchair     Assist Is the patient using a wheelchair?: Yes Type of Wheelchair: Manual (pt's personal rigid frame  wheelchair)    Wheelchair assist level: Independent Max wheelchair distance: 378ft    Wheelchair 50 feet with 2 turns activity    Assist        Assist Level: Independent   Wheelchair 150 feet activity     Assist      Assist Level: Independent   Blood pressure 94/69, pulse 82, temperature 97.8 F (36.6 C), temperature source Oral, resp. rate 18, height 5\' 1"  (1.549 m), weight 37.1 kg, SpO2 100%.  Medical Problem List and Plan: 1. 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  Completed IVIG.             -patient may  shower             -ELOS/Goals: 16-20 days- mod I at w/c level             Admit to CIR- discussed prognosis with pt/father  D/c 8/20  Con't CIR PT and OT 2.  Antithrombotics: -DVT/anticoagulation:  Mechanical: Antiembolism stockings, thigh (TED hose)  Bilateral lower extremities  8/16- pt in w/c at all times- likelihood of DVTs is extremely low since at level of function. Doesn't want lovenox             -antiplatelet therapy: N/A 3. Pain Management: Neurontin 300 mg twice daily, Advil as needed 4. Mood/Behavior/Sleep: Melatonin nightly, Atarax as needed anxiety.  Provide emotional support             -antipsychotic agents: N/A 5. Neuropsych/cognition: This patient is capable of making decisions on his own behalf. 6. Skin/Wound Care: Routine skin checks 7. Fluids/Electrolytes/Nutrition: Routine in and outs with follow-up chemistries. 8. Chronic constipation- LBM 1 week ago- he doesn't think he needs assistance with this  8/14- will order miralax prn if pt needs it- if no BM in next 1-2 days, will need sorbitol- educated pt that AIDP causes slowing of gut as well as weakness-causing. 8/15- no BM yet- wants ot wait on Sorbitol per pt- 8/16- no BM yet- needs to have sorbitol 15cc- after therapy. Since no BM for   10 days now 9. W/C- is 37 years old- will see if pt wants [facility] to do new w/c eval  while in-pt.   8/15- getting w/c eval today or tomorrow- pt agrees wants ot do-  his own w/c has no brakes as well. 8/16- got brakes, but tight and keeps hitting them- different configuration- also fitted for new w/c  10. Nerve pain- doesn't want meds other than gabapentin 300 mg BID      Pt needs ultralight manual w/c- due to incomplete (sensation only) paraplegia with low back- on chair- doesn't have spasticity, - has a lot of muscle atrophy, so w/c needs to compensate for these issues- due to post polio and Spinal TB as child with severe LE muscle atrophy and lack of LE movement- paraplegia as a result.    I spent a total of 36   minutes on total care today- >50% coordination of care- due to  IPOC today; also d/w OT about d/c date- decided next Tuesday 8/20- also about bowels-   LOS: 3 days A FACE TO FACE EVALUATION WAS PERFORMED  Alexander Mcgrath 10/22/2022, 7:53 AM

## 2022-10-22 NOTE — Progress Notes (Signed)
Occupational Therapy Session Note  Patient Details  Name: Jakoda Mullings MRN: [id number] Date of Birth: [birth year]  Today's Date: 10/22/2022 OT Individual Time: 0700-0810 OT Individual Time Calculation (min): 70 min    Short Term Goals: Week 1:  OT Short Term Goal 1 (Week 1): STG=LTG  Skilled Therapeutic Interventions/Progress Updates:    OT intervention with focus on BADLs, w/c mobility, tub seat transfers, and BUE strengtheing. All transfers with CGA to steady w/c. Bathing at shower level with supervision seated on TTB. Dressing with CGA seated on TTB. Pt practiced tub seat tranfsers in tub room with supervision. Pt issued handout for theraputty exercises. Pt return demonstrated exercises. Pt returned to room and remained in w/c. All needs within reach.   Therapy Documentation Precautions:  Precautions Precautions: Fall Restrictions Weight Bearing Restrictions: Yes Other Position/Activity Restrictions: no use of lower LEs Pain:  Pt denies pain this morning but reports BUE/hand numbness; MD aware   Therapy/Group: Individual Therapy  Rich Brave 10/22/2022, 8:17 AM

## 2022-10-22 NOTE — Progress Notes (Signed)
Physical Therapy Session Note  Patient Details  Name: Alexander Mcgrath MRN: [id number] Date of Birth: February 16, 1986  Today's Date: 10/22/2022 PT Individual Time: 0810-0925 PT Individual Time Calculation (min): 75 min   Short Term Goals: Week 1:  PT Short Term Goal 1 (Week 1): = to LTGs based on ELOS  Skilled Therapeutic Interventions/Progress Updates:    pt received in w/c and agreeable to therapy. No complaint of pain.   Transfers with no brakes with close supervision, to CGA stabilizing w/c.   Session focused on UE strength and endurance.  While sitting on red disc, pt performed holds of tidal tank for sitting balance and core strength. Able to maintain up to 40 seconds. Progressed to [facility] press +chest press, 4 x 6 with tidal tank for improved strength for car transfer.  Pt then used poker chips and clothespins for hand strength and coordination.   Progressed to w/c mobility with emphasis on w/c skills and curb navigation. Able to navigate 3" curb, but reports does not feel confident. Will benefit from practice at higher heights and progress to outdoor curb.  Returned to room and remained in chair with needs in reach.   Therapy Documentation Precautions:  Precautions Precautions: Fall Restrictions Weight Bearing Restrictions: Yes Other Position/Activity Restrictions: no use of lower LEs General:       Therapy/Group: Individual Therapy  Juluis Rainier 10/22/2022, 12:17 PM

## 2022-10-22 NOTE — IPOC Note (Signed)
Overall Plan of Care [facility]) Patient Details Name: Alexander Mcgrath MRN: [id number] DOB: 18-Aug-1985  Admitting Diagnosis: AIDP (acute inflammatory demyelinating polyneuropathy) [facility])  Hospital Problems: Principal Problem:   AIDP (acute inflammatory demyelinating polyneuropathy) (HCC)     Functional Problem List: Nursing Bowel, Endurance, Medication Management, Pain, Safety  PT Balance, Safety, Sensory, Skin Integrity, Endurance, Motor, Nutrition, Pain  OT Balance, Endurance, Motor  SLP    TR         Basic ADL's: OT Bathing, Dressing, Toileting     Advanced  ADL's: OT       Transfers: PT Bed Mobility, Bed to Chair, Car, Floor, Occupational psychologist, Research scientist (life sciences): PT Wheelchair Mobility     Additional Impairments: OT None  SLP        TR      Anticipated Outcomes Item Anticipated Outcome  Self Feeding n/a  Swallowing      Basic self-care  mod I  Toileting  mod I   Bathroom Transfers mod I  Bowel/Bladder  manage bowel w mod I assist  Transfers  mod-I wheelchair level  Locomotion  N/A  Communication     Cognition     Pain  < 4 with prns  Safety/Judgment  manage w cues   Therapy Plan: PT Intensity: Minimum of 1-2 x/day ,45 to 90 minutes PT Frequency: 5 out of 7 days PT Duration Estimated Length of Stay: 5-7 days OT Intensity: Minimum of 1-2 x/day, 45 to 90 minutes OT Frequency: 5 out of 7 days OT Duration/Estimated Length of Stay: ~ 5 days     Team Interventions: Nursing Interventions Disease Management/Prevention, Medication Management, Discharge Planning, Pain Management, Bowel Management, Patient/Family Education  PT interventions Community reintegration, DME/adaptive equipment instruction, Neuromuscular re-education, Psychosocial support, UE/LE Strength taining/ROM, Wheelchair propulsion/positioning, Warden/ranger, Discharge planning, Functional electrical stimulation, Pain management, Therapeutic Activities, UE/LE  Coordination activities, Disease management/prevention, Functional mobility training, Patient/family education, Splinting/orthotics, Therapeutic Exercise  OT Interventions Balance/vestibular training, Disease mangement/prevention, Neuromuscular re-education, Self Care/advanced ADL retraining, Therapeutic Exercise, DME/adaptive equipment instruction, Skin care/wound managment, UE/LE Strength taining/ROM, Wheelchair propulsion/positioning, Firefighter, Equities trader education, UE/LE Coordination activities, Discharge planning, Functional mobility training, Psychosocial support, Therapeutic Activities, Visual/perceptual remediation/compensation  SLP Interventions    TR Interventions    SW/CM Interventions Discharge Planning, Psychosocial Support, Patient/Family Education   Barriers to Discharge MD  Medical stability, Home enviroment access/loayout, Lack of/limited family support, Weight, and Weight bearing restrictions  Nursing Decreased caregiver support, Home environment access/layout 1 level ramped entry;  mod I from w/c level, typically "throws self" into w/c that does not have brakes. reports if he falls out of the w/c, he can get himself back into the chair  ADLs Comments: works as a Psychologist, occupational, drives (Comptroller), plays adaptive basketball. PT's lightweight WC does not have brakes  PT Decreased caregiver support    OT      SLP      SW Decreased caregiver support, Insurance for SNF coverage, Lack of/limited family support     Team Discharge Planning: Destination: PT-Home ,OT- Home , SLP-  Projected Follow-up: PT-Outpatient PT, 24 hour supervision/assistance, OT-  None, SLP-  Projected Equipment Needs: PT-Other (comment), OT- None recommended by OT, SLP-  Equipment Details: PT-custom wheelchair eval with [facility], OT-  Patient/family involved in discharge planning: PT- Patient,  OT-Patient, SLP-   MD ELOS: 5-7 days Medical Rehab Prognosis:  Excellent Assessment: The  patient has been admitted for CIR therapies with the diagnosis of AIDP in  setting of post polio- w/c level. The team will be addressing functional mobility, strength, stamina, balance, safety, adaptive techniques and equipment, self-care, bowel and bladder mgt, patient and caregiver education, w/c eval. Goals have been set at mod I. Anticipated discharge destination is home.        See Team Conference Notes for weekly updates to the plan of care

## 2022-10-23 DIAGNOSIS — E440 Moderate protein-calorie malnutrition: Secondary | ICD-10-CM | POA: Insufficient documentation

## 2022-10-23 NOTE — Progress Notes (Signed)
PROGRESS NOTE   Subjective/Complaints:  Large BM 8/16 after sorbitol 15ml, pt feels like he is doing well  ROS   Pt denies SOB, abd pain, CP, N/V/ (+)C/D, and vision changes  Except for HPI  Objective:   No results found. No results for input(s): "WBC", "HGB", "HCT", "PLT" in the last 72 hours.  No results for input(s): "NA", "K", "CL", "CO2", "GLUCOSE", "BUN", "CREATININE", "CALCIUM" in the last 72 hours.   Intake/Output Summary (Last 24 hours) at 10/23/2022 1307 Last data filed at 10/23/2022 1148 Gross per 24 hour  Intake 476 ml  Output 400 ml  Net 76 ml        Physical Exam: Vital Signs Blood pressure 101/75, pulse 84, temperature 98.1 F (36.7 C), temperature source Oral, resp. rate 18, height 5\' 1"  (1.549 m), weight 37.1 kg, SpO2 100%.  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 No tenderness.     Comments: RUE- biceps 4+/5' Triceps 4+/5; WE 4+/5; grip 4/5 and FA 2+/5 LUE- biceps 4/5; Triceps 3+/5; WE 4-/5; Grip 3+/5 and FA 2/5 LE's-t race movement in HF/KE- on LLE- but otherwise 0/5 Muscle atrophy of B/L LE's- due to polio  Skin:    General: Skin is warm and dry.     Comments: No skin breakdown seen  Neurological:     Mental Status: He is alert.     Comments: Patient is alert.  No acute distress.  Sitting up in bed with father at bedside.  Speech is a bit dysarthric but intelligible.  Oriented x 3. 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  Functional deficits which require 3+ hours per day of interdisciplinary therapy in a comprehensive inpatient rehab setting. Physiatrist is providing close team supervision and 24 hour management of active medical problems listed below. Physiatrist and rehab team continue to assess barriers to discharge/monitor patient progress toward functional and medical goals  Care Tool:  Bathing    Body parts bathed by patient: Right arm, Left arm, Chest, Abdomen, Front perineal area, Buttocks, Right upper leg, Left upper leg, Face, Right lower leg, Left lower leg         Bathing assist Assist Level: Supervision/Verbal cueing     Upper Body Dressing/Undressing Upper body dressing   What is the patient wearing?: Pull over shirt    Upper body assist Assist Level: Independent    Lower Body Dressing/Undressing Lower body dressing      What is the patient wearing?: Underwear/pull up, Pants     Lower body assist Assist for lower body dressing: Contact Guard/Touching assist     Toileting Toileting    Toileting assist Assist for toileting: Moderate Assistance - Patient 50 - 74%     Transfers Chair/bed transfer  Transfers assist  Chair/bed transfer assist level: Minimal Assistance - Patient > 75%     Locomotion Ambulation   Ambulation assist   Ambulation activity did not occur: N/A          Walk 10 feet activity   Assist  Walk 10 feet activity did not occur: N/A        Walk 50 feet activity   Assist Walk 50 feet with 2 turns activity did not occur: N/A         Walk 150 feet activity   Assist Walk 150 feet activity did not occur: N/A         Walk 10 feet on uneven surface  activity   Assist Walk 10 feet on uneven surfaces activity did not occur: N/A         Wheelchair     Assist Is the patient using a wheelchair?: Yes Type of Wheelchair: Manual (pt's personal rigid frame wheelchair)    Wheelchair assist level: Independent Max  wheelchair distance: 333ft    Wheelchair 50 feet with 2 turns activity    Assist        Assist Level: Independent   Wheelchair 150 feet activity     Assist      Assist Level: Independent   Blood pressure 101/75, pulse 84, temperature 98.1 F (36.7 C), temperature source Oral, resp. rate 18, height 5\' 1"  (1.549 m), weight 37.1 kg, SpO2 100%.  Medical Problem List and Plan: 1. 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  Completed IVIG.             -patient may  shower             -ELOS/Goals: 16-20 days- mod I at w/c level  D/c 8/20  Con't CIR PT and OT 2.  Antithrombotics: -DVT/anticoagulation:  Mechanical: Antiembolism stockings, thigh (TED hose)  Bilateral lower extremities  8/16- pt in w/c at all times- likelihood of DVTs is extremely low since at level of function. Doesn't want lovenox             -antiplatelet therapy: N/A 3. Pain Management: Neurontin 300 mg twice daily, Advil as needed 4. Mood/Behavior/Sleep: Melatonin nightly, Atarax as needed anxiety.  Provide emotional support             -antipsychotic agents: N/A 5. Neuropsych/cognition: This patient is capable of making decisions on his own behalf. 6. Skin/Wound Care: Routine skin checks 7. Fluids/Electrolytes/Nutrition: Routine in and outs with follow-up chemistries. 8. Chronic constipation- LBM 1 week ago- he doesn't think he needs assistance with this  8/14- will order miralax prn if pt needs it- if no BM in next 1-2 days, will need sorbitol- educated pt that AIDP causes slowing of gut as well as weakness-causing. 8/15- no BM yet- wants ot wait on Sorbitol per pt- 8/16- no BM yet- needs to have sorbitol 15cc- after therapy. Since no BM for   10 days now 9. W/C- is 37 years old- will see if pt wants [facility] to do new w/c eval while in-pt.   8/15- getting w/c eval today or tomorrow- pt agrees wants ot do- his own w/c has no brakes as well. 8/16-  got brakes, but tight and keeps hitting them- different configuration- also fitted for new w/c  10. 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      LOS: 4 days A FACE TO FACE EVALUATION WAS PERFORMED  Erick Colace 10/23/2022, 1:07 PM

## 2022-10-23 NOTE — Progress Notes (Signed)
Pt did NIF & VC with great effort.  NIF -30 VC 1.2L  RT will monitor.

## 2022-10-24 NOTE — Progress Notes (Signed)
Physical Therapy Session Note  Patient Details  Name: Alexander Mcgrath MRN: [id number] Date of Birth: [birth year]  Today's Date: 10/24/2022 PT Individual Time: 1430-1511 PT Individual Time Calculation (min): 41 min   Short Term Goals: Week 1:  PT Short Term Goal 1 (Week 1): = to LTGs based on ELOS  Skilled Therapeutic Interventions/Progress Updates:  Pt was seen bedside in the pm. Pt propelled w/c independently 200 feet with B UEs. Treatment focused on car transfers on driving side, including breakdown w/c with close supervision to contact guard with verbal cues. Pt performed transfer in/out of car as well as breaking down/assembling w/c x 3. Pt requiring contact guard to no brakes only. Pt's states transfer is getting easier just needs to be stronger and also limited by numbness B hands. Pt propelled w/c 500 feet independently with B UEs. Pt returned to room and left sitting in w/c with all needs within reach.   Therapy Documentation Precautions:  Precautions Precautions: Fall Restrictions Weight Bearing Restrictions: Yes Other Position/Activity Restrictions: no use of lower LEs General:   Pain: No c/o pain.   Therapy/Group: Individual Therapy  Rayford Halsted 10/24/2022, 3:16 PM

## 2022-10-24 NOTE — Progress Notes (Signed)
Physical Therapy Session Note  Patient Details  Name: Alexander Mcgrath MRN: [id number] Date of Birth: 06-23-1985  Today's Date: 10/24/2022 PT Individual Time: 1300-1350 PT Individual Time Calculation (min): 50 min   Short Term Goals: Week 1:  PT Short Term Goal 1 (Week 1): = to LTGs based on ELOS  Skilled Therapeutic Interventions/Progress Updates:      Therapy Documentation Precautions:  Precautions Precautions: Fall Restrictions Weight Bearing Restrictions: Yes Other Position/Activity Restrictions: no use of lower LEs  Pt received seated in w/c at bedside with nurse tech present for vitals. Pt independent with wheelchair mobility with BUE's to main gym. Pt performed floor transfer with (S) and requires increased time to return from mat to w/c due to decreased triceps strength. PT stabilized w/c during transfer for safety.   Pt navigated w/c to ortho gym and participated in various strengthing and NMR exercises to address UE deficits. Pt performed DIP extension/flexion exercises to address dexterity deficits that impede high level sporting activities.   Pt utilized 2.2# weighted ball and performed alternating resisted wrist extension/flexion in various positions to simulate dribbling and shooting basketball. Pt utilized yellow physio ball to practice dribbling and with noted coordination deficits bilaterally.   Pt returned to room and left seated in w/c at bedside with all needs in reach. Discussed with pt regarding follow up therapies and pt interested in OP hand OT. Will notify SW and primary OT.    Therapy/Group: Individual Therapy  Truitt Leep Truitt Leep PT, DPT  10/24/2022, 3:28 PM

## 2022-10-24 NOTE — Progress Notes (Addendum)
Occupational Therapy Session Note  Patient Details  Name: Alexander Mcgrath MRN: [id number] Date of Birth: [birth year]  Today's Date: 10/24/2022 OT Individual Time: 4098-1191 OT Individual Time Calculation (min): 72 min    Short Term Goals: Week 1:  OT Short Term Goal 1 (Week 1): STG=LTG  Skilled Therapeutic Interventions/Progress Updates:    OT intervention with focus on BADLs, tranfsers, BUE strengthening/grip strength, and discharge planning. All transfers with supervision. Bathing at shower level seated on TTB. Pt transfers to w/c for LB dressing. Pt propelled to gym to focus on BUE/hand strengtheing. Theraputty tasks, practice "palming/gripping" various ball sizes, tossing weighted ball against bounce back. Pt reports strength is improving but not adequate to palm basketball. Pt propelled to ADL kitchen to practice retrieving items from various heights. Pt able to prop himself on w/c wheel and support self to reach 2nd shelf in kitchen.Pt returned to room and remained in his w/c. All needs within reach.   Therapy Documentation Precautions:  Precautions Precautions: Fall Restrictions Weight Bearing Restrictions: Yes Other Position/Activity Restrictions: no use of lower LEs   Pain:  Pt denies pain this morning  Therapy/Group: Individual Therapy  Rich Brave 10/24/2022, 8:17 AM

## 2022-10-24 NOTE — Patient Care Conference (Signed)
Inpatient RehabilitationTeam Conference and Plan of Care Update Date:10/22/2022   Time: 08:21 AM    Patient Name: Alexander Mcgrath      Medical Record Number: [id number]  Date of Birth: 03-30-1985 Sex: Male         Room/Bed: 4W24C/4W24C-01 Payor Info: Payor: [employer] / Plan: BCBS COMM PPO / Product Type: *No Product type* /    Admit Date/Time:  10/19/2022  2:55 PM  Primary Diagnosis:  AIDP (acute inflammatory demyelinating polyneuropathy) [facility])  Hospital Problems: Principal Problem:   AIDP (acute inflammatory demyelinating polyneuropathy) (HCC) Active Problems:   Difficulty coping   Malnutrition of moderate degree    Expected Discharge Date: Expected Discharge Date: 10/26/22  Team Members Present: Physician leading conference: Dr. Genice Rouge Social Worker Present: Cecile Sheerer, LCSWA Nurse Present: Vedia Pereyra, RN PT Present: Bernie Covey, PT OT Present: Ardis Rowan, COTA;Jennifer Katrinka Blazing, OT     Current Status/Progress Goal Weekly Team Focus  Bowel/Bladder    Continent of bowel and bladder *** Remain continent of bowel and bladder  Toilet as needed   Swallow/Nutrition/ Hydration      ***         ADL's   mod I/supervision overall; hands weak but improving  *** mod I overall   Barriers: decreased UB/hand strength limiting with car transfers Focus: discharge planning, UE strengthening, sitting balance    Mobility   supervision transfers, nearing mod I, but occassionally requires assist d/t not using brakes on w/c  *** mod I overall  Barriers UE strength limiting car transfers    Communication      ***          Safety/Cognition/ Behavioral Observations     ***          Pain    Denies pain  *** Less than 4  Assess pain every 4 hours and PRN    Skin    Clean, dry, and intact  ***  Skin remain clean, dry, and intact  Assess every shift and as needed     Discharge Planning:  Pt will d/c to home toa family member's  home or his home if he can be Mod I at d/c. SW will confirm there are no barriers to discharge.   Team Discussion: AIDP. Working on upper extremity strength and car transfers for increased independence. Patient on target to meet rehab goals: yes, continuing to progress to Mod I goals with discharge date of 10/26/22  *See Care Plan and progress notes for long and short-term goals.   Revisions to Treatment Plan:  N/A  Teaching Needs: Medications, safety, skin care, transfer training, self care, etc.   Current Barriers to Discharge: Decreased caregiver support, Weight, and Weight bearing restrictions  Possible Resolutions to Barriers: Family education Order recommended DME if needed     Medical Summary               I attest that I was present, lead the team conference, and concur with the assessment and plan of the team.   Jearld Adjutant 10/24/2022, 11:45 AM

## 2022-10-24 NOTE — Progress Notes (Signed)
Occupational Therapy Session Note  Patient Details  Name: Alexander Mcgrath MRN: [id number] Date of Birth: [birth year]  Today's Date: 10/24/2022 OT Individual Time: 1015-1055 OT Individual Time Calculation (min): 40 min    Short Term Goals: Week 1:  OT Short Term Goal 1 (Week 1): STG=LTG  Skilled Therapeutic Interventions/Progress Updates:    OT intervention with focus on w/c mobility in community setting and BUE/hand strength. Pt propelled w/c out of [facility] entrance on on various surfaces in courtyard. Pt able to safely navigate/open heavy doors at w/c level. Pt returned to gym and practiced shooting baskets with std basketball. Pt remarked that his strength/endurance needs to improve to return to competitive adaptive basketball. Pt returned to room. All needs wihtin reach.   Therapy Documentation Precautions:  Precautions Precautions: Fall Restrictions Weight Bearing Restrictions: Yes Other Position/Activity Restrictions: no use of lower LEs   Pain:  Pt denies pain this morning   Therapy/Group: Individual Therapy  Rich Brave 10/24/2022, 11:08 AM

## 2022-10-24 NOTE — Progress Notes (Signed)
PROGRESS NOTE   Subjective/Complaints: No c/o today  Large BM 8/16 after sorbitol 15ml, pt feels like he is doing well  ROS   Pt denies SOB, abd pain, CP, N/V/ (+)C/D, and vision changes  Except for HPI  Objective:   No results found. No results for input(s): "WBC", "HGB", "HCT", "PLT" in the last 72 hours.  No results for input(s): "NA", "K", "CL", "CO2", "GLUCOSE", "BUN", "CREATININE", "CALCIUM" in the last 72 hours.   Intake/Output Summary (Last 24 hours) at 10/24/2022 0923 Last data filed at 10/24/2022 0716 Gross per 24 hour  Intake 600 ml  Output 1300 ml  Net -700 ml        Physical Exam: Vital Signs Blood pressure 113/83, pulse 74, temperature 98.1 F (36.7 C), temperature source Oral, resp. rate 18, height 5\' 1"  (1.549 m), weight 37.1 kg, SpO2 99%.  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 No tenderness.     Comments: RUE- biceps 4+/5' Triceps 4+/5; WE 4+/5; grip 4/5 and FA 2+/5 LUE- biceps 4/5; Triceps 3+/5; WE 4-/5; Grip 3+/5 and FA 2/5 LE's-t race movement in HF/KE- on LLE- but otherwise 0/5 Muscle atrophy of B/L LE's- due to polio  Skin:    General: Skin is warm and dry.     Comments: No skin breakdown seen  Neurological:     Mental Status: He is alert.     Comments: Patient is alert.  No acute distress.  Sitting up in bed with father at bedside.  Speech is a bit dysarthric but intelligible.  Oriented x 3. 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 Functional deficits which require 3+ hours per day of interdisciplinary therapy in a comprehensive inpatient rehab setting. Physiatrist is providing close team supervision and 24 hour management of active medical problems listed below. Physiatrist and rehab team continue to assess barriers to discharge/monitor patient progress toward functional and medical goals  Care Tool:  Bathing    Body parts bathed by patient: Right arm, Left arm, Chest, Abdomen, Front perineal area, Buttocks, Right upper leg, Left upper leg, Face, Right lower leg, Left lower leg         Bathing assist Assist Level: Supervision/Verbal cueing     Upper Body Dressing/Undressing Upper body dressing   What is the patient wearing?: Pull over shirt    Upper body assist Assist Level: Independent    Lower Body Dressing/Undressing Lower body dressing      What is the patient wearing?: Underwear/pull up, Pants     Lower body assist Assist for lower body dressing: Contact Guard/Touching assist     Toileting Toileting    Toileting assist Assist for toileting: Moderate Assistance - Patient 50 - 74%     Transfers Chair/bed transfer  Transfers  assist     Chair/bed transfer assist level: Minimal Assistance - Patient > 75%     Locomotion Ambulation   Ambulation assist   Ambulation activity did not occur: N/A          Walk 10 feet activity   Assist  Walk 10 feet activity did not occur: N/A        Walk 50 feet activity   Assist Walk 50 feet with 2 turns activity did not occur: N/A         Walk 150 feet activity   Assist Walk 150 feet activity did not occur: N/A         Walk 10 feet on uneven surface  activity   Assist Walk 10 feet on uneven surfaces activity did not occur: N/A         Wheelchair     Assist Is the patient using a wheelchair?: Yes Type of Wheelchair: Manual (pt's personal rigid frame wheelchair)    Wheelchair assist  level: Independent Max wheelchair distance: 324ft    Wheelchair 50 feet with 2 turns activity    Assist        Assist Level: Independent   Wheelchair 150 feet activity     Assist      Assist Level: Independent   Blood pressure 113/83, pulse 74, temperature 98.1 F (36.7 C), temperature source Oral, resp. rate 18, height 5\' 1"  (1.549 m), weight 37.1 kg, SpO2 99%.  Medical Problem List and Plan: 1. 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  Completed IVIG.             -patient may  shower             -ELOS/Goals: 16-20 days- mod I at w/c level  D/c 8/20  Con't CIR PT and OT 2.  Antithrombotics: -DVT/anticoagulation:  Mechanical: Antiembolism stockings, thigh (TED hose)  Bilateral lower extremities  8/16- pt in w/c at all times- likelihood of DVTs is extremely low since at level of function. Doesn't want lovenox             -antiplatelet therapy: N/A 3. Pain Management: Neurontin 300 mg twice daily, Advil as needed 4. Mood/Behavior/Sleep: Melatonin nightly, Atarax as needed anxiety.  Provide emotional support             -antipsychotic agents: N/A 5. Neuropsych/cognition: This patient is capable of making decisions on his own behalf. 6. Skin/Wound Care: Routine skin checks 7. Fluids/Electrolytes/Nutrition: Routine in and outs with follow-up chemistries. 8. Chronic constipation- LBM 1 week ago- he doesn't think he needs assistance with this  8/14- will order miralax prn if pt needs it- if no BM in next 1-2 days, will need sorbitol- educated pt that AIDP causes slowing of gut as well as weakness-causing. 8/15- no BM yet- wants ot wait on Sorbitol per pt- 8/16- no BM yet- needs to have sorbitol 15cc- after therapy. Since no BM for   10 days now 9. W/C- is 37 years old- will see if pt wants [facility] to do new w/c eval while in-pt.   8/15- getting w/c eval today or tomorrow- pt agrees wants ot do- his own w/c has no  brakes as well. 8/16- got brakes, but tight and keeps hitting them- different configuration- also fitted for new w/c  10. 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      LOS: 5 days A FACE TO FACE EVALUATION WAS PERFORMED  Alexander Mcgrath 10/24/2022, 9:23 AM

## 2022-10-24 NOTE — Discharge Summary (Signed)
Physician Discharge Summary  Patient ID: Alexander Mcgrath MRN: [id number] DOB/AGE: Jan 14, 1986 37 y.o.  Admit date: 10/19/2022 Discharge date: 10/26/2022  Discharge Diagnoses:  Principal Problem:   AIDP (acute inflammatory demyelinating polyneuropathy) (HCC) Active Problems:   Difficulty coping   Malnutrition of moderate degree Chronic constipation History of polio/spinal TB  Discharged Condition: Stable  Significant Diagnostic Studies: DG FL GUIDED LUMBAR PUNCTURE  Result Date: 10/13/2022 CLINICAL DATA:  37 year old male with remote history of polio and spinal tuberculosis presents with numbness and weakness in bilateral lower extremities which has spread to bilateral upper extremities as well. EXAM: DIAGNOSTIC LUMBAR PUNCTURE UNDER FLUOROSCOPIC GUIDANCE COMPARISON:  None Available. FLUOROSCOPY: Radiation Exposure Index (as provided by the fluoroscopic device): 28.3 mGy Kerma PROCEDURE: Informed consent was obtained from the patient prior to the procedure, including potential risks and complications of bleeding, infection, damage to adjacent structures, CSF leak, headache, allergy, and pain. With the patient prone, the lower back was prepped with Betadine. 1% Lidocaine was used for local anesthesia. Lumbar puncture was attempted at the L3/4 level using a 20 gauge needle, unsuccessful. Second lumbar puncture was attempted at the L4-L5 level, again unsuccessful. Dr. Reche Dixon was called to the procedure room for assistance. Dr. Reche Dixon attempted lumbar puncture at the L4-L5 level, unsuccessful. Despite optimal needle positioning (example series 4) CSF did not return. Another lumbar puncture attempt was made at the L5-S1 level, patient developed shooting sensation in his left leg, however CSF could not be obtained. Unsuccessful fluoro guided lumbar puncture. The patient tolerated the procedure well and there were no apparent complications. IMPRESSION: Unsuccessful fluoro guided lumbar puncture, as  detailed above. Exam complicated by extensive thoracolumbar spine hardware. This procedure was performed by Lawernce Ion, PA-C under the supervision of Jeronimo Greaves, MD. Electronically Signed   By: Jeronimo Greaves M.D.   On: 10/13/2022 13:39   MR Brain W and Wo Contrast  Result Date: 10/12/2022 CLINICAL DATA:  Left lower extremity numbness. History of polio and tuberculosis. EXAM: MRI HEAD WITHOUT AND WITH CONTRAST TECHNIQUE: Multiplanar, multiecho pulse sequences of the brain and surrounding structures were obtained without and with intravenous contrast. CONTRAST:  4mL GADAVIST GADOBUTROL 1 MMOL/ML IV SOLN COMPARISON:  None Available. FINDINGS: Brain: No acute infarct, mass effect or extra-axial collection. No acute or chronic hemorrhage. Normal white matter signal, parenchymal volume and CSF spaces. The midline structures are normal. Vascular: Major flow voids are preserved. Skull and upper cervical spine: Normal calvarium and skull base. Visualized upper cervical spine and soft tissues are normal. Sinuses/Orbits:No paranasal sinus fluid levels or advanced mucosal thickening. No mastoid or middle ear effusion. Normal orbits. IMPRESSION: Normal brain MRI. Electronically Signed   By: Deatra Robinson M.D.   On: 10/12/2022 20:09   MR Cervical Spine W and Wo Contrast  Result Date: 10/12/2022 CLINICAL DATA:  Paresthesia. Remote history of polio. Remote history of spinal TB. 5 days of numbness in left leg that has now spread to his bilateral upper extremities. EXAM: MRI CERVICAL SPINE WITHOUT AND WITH CONTRAST TECHNIQUE: Multiplanar and multiecho pulse sequences of the cervical spine, to include the craniocervical junction and cervicothoracic junction, were obtained without and with intravenous contrast. CONTRAST:  4mL GADAVIST GADOBUTROL 1 MMOL/ML IV SOLN COMPARISON:  None Available. FINDINGS: Alignment: There is mild kyphotic angulation centered at C4-5. No sagittal spondylolisthesis. The atlantodens interval is intact.  Vertebrae: Vertebral body heights are maintained. Minimal anterior C5-6 and C6-7 disc space narrowing. w within the limitations of patient motion artifact, no significant bone signal  abnormality is seen. Cord: The cervical cord demonstrates normal signal and caliber. No abnormal cord enhancement. Posterior Fossa, vertebral arteries, paraspinal tissues: Negative. Disc levels: C2-3: Mild bilateral uncovertebral hypertrophy. No posterior disc bulge. No central canal or neuroforaminal stenosis. C3-4: Mild bilateral facet joint hypertrophy. Mild left uncovertebral hypertrophy. Borderline mild left neuroforaminal stenosis. The right neural foramen is patent. No central canal stenosis. C4-5: Mild bilateral facet joint hypertrophy. No posterior disc bulge, central canal narrowing, or neuroforaminal stenosis. C5-6: Mild bilateral facet joint hypertrophy. Mild posterior disc bulge with mild bilateral intraforaminal extension. No significant neuroforaminal stenosis. No central canal stenosis. C6-7: Mild posterior disc bulge. CSF is still seen ventral to the cord. No central canal or neuroforaminal stenosis. C7-T1: No posterior disc bulge, central canal narrowing, or neuroforaminal stenosis. T1-2: Unremarkable. IMPRESSION: 1. Mild multilevel degenerative disc and joint changes as above. 2. Borderline mild left C3-4 neuroforaminal stenosis. 3. No central canal stenosis. Electronically Signed   By: Neita Garnet M.D.   On: 10/12/2022 19:50   DG Lumbar Spine Complete  Result Date: 10/12/2022 CLINICAL DATA:  Spinal fusion. EXAM: LUMBAR SPINE - COMPLETE 4+ VIEW COMPARISON:  January 04, 2006. 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 No fracture or spondylolisthesis is noted. Severe dextroscoliosis of thoracic spine is noted. Fusion of multiple disc spaces in the lower thoracic and upper lumbar spine is noted. IMPRESSION: Postsurgical changes as noted above.  No acute abnormality  seen. Electronically Signed   By: Lupita Raider M.D.   On: 10/12/2022 16:42   CT Head Wo Contrast  Result Date: 10/08/2022 CLINICAL DATA:  Headache. EXAM: CT HEAD WITHOUT CONTRAST TECHNIQUE: Contiguous axial images were obtained from the base of the skull through the vertex without intravenous contrast. RADIATION DOSE REDUCTION: This exam was performed according to the departmental dose-optimization program which includes automated exposure control, adjustment of the mA and/or kV according to patient size and/or use of iterative reconstruction technique. COMPARISON:  No comparison studies available. FINDINGS: Brain: There is no evidence for acute hemorrhage, hydrocephalus, mass lesion, or abnormal extra-axial fluid collection. No definite CT evidence for acute infarction. Vascular: No hyperdense vessel or unexpected calcification. Skull: No evidence for fracture. No worrisome lytic or sclerotic lesion. Sinuses/Orbits: The visualized paranasal sinuses and mastoid air cells are clear. Visualized portions of the globes and intraorbital fat are unremarkable. Other: None. IMPRESSION: Unremarkable head CT. No acute intracranial abnormality. Electronically Signed   By: Kennith Center M.D.   On: 10/08/2022 19:11    Labs:  Basic Metabolic Panel: Recent Labs  Lab 10/20/22 0719  NA 134*  K 4.0  CL 98  CO2 26  GLUCOSE 107*  BUN 12  CREATININE 0.54*  CALCIUM 9.7    CBC: Recent Labs  Lab 10/20/22 0719  WBC 6.2  NEUTROABS 3.3  HGB 13.8  HCT 41.2  MCV 90.2  PLT 247    CBG: No results for input(s): "GLUCAP" in the last 168 hours.  Brief HPI:   Alexander Mcgrath is a 37 y.o. 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  He was seen for these diagnoses at [facility] by pediatric PM&R and neurology.  Per chart review lives alone 1 level home ramped  entrance.  Modified independent wheelchair level he works as a Psychologist, occupational.  He still drives and plays adaptive basketball.  Patient was last  seen by PCP 10/08/2018 for chief complaint of left hand and tongue numbness left medial forearm numbness and was sent to the ED at [facility] for a C-spine and brain MRI that showed no significant findings except mild degenerative changes of the cervical spine.  He was admitted 10/12/2022 to [facility].  Patient found to have elevated coxsackie titers, neurology consulted felt this was AIDP and underwent IVIG x 5 treatments with improvement left upper extremity sensation and strength.  Neurology follow-up during hospital stay placed on vitamin B12.  Neurontin ongoing for neuropathic pain.  Plans to follow-up with Nita Sickle at neurology services with [facility] neurology.  Therapy evaluations completed due to patient's decreased functional mobility was admitted for comprehensive rehab program.   Hospital Course: Kohlman Bianca was admitted to rehab 10/19/2022 for inpatient therapies to consist of PT, ST and OT at least three hours five days a week. Past admission physiatrist, therapy team and rehab RN have worked together to provide customized collaborative inpatient rehab.  Pertaining to patient's presumed AIDP/bilateral weakness with history of polio and spinal TB.  Completed IVIG.  Follow-up outpatient neurology services.  Pain manager use of Neurontin scheduled as advised.  Bouts of chronic constipation resolved with laxative assistance.  Close follow-up by therapy team in regards to adapting patient with new wheelchair as needed.   Blood pressures were monitored on TID basis and controlled   Rehab course: During patient's stay in rehab weekly team conferences were held to monitor patient's progress, set goals and discuss barriers to discharge. At admission, patient required moderate assist squat pivot transfers independent wheelchair  mobility  Physical exam.  Blood pressure 114/70 pulse 99 temperature 97.8 respirations 18 oxygen saturation is 95% room air Constitutional.  No acute distress HEENT Head.  Normocephalic and atraumatic Eyes.  Pupils round and reactive to light no discharge without nystagmus Neck.  Supple nontender no JVD without thyromegaly Cardiac regular rate and rhythm without any extra sounds or murmur heard Abdomen.  Soft nontender positive bowel sounds without rebound Respiratory effort normal no respiratory distress without wheeze Musculoskeletal Comments.  Right upper extremity biceps 4+/5 triceps 4+/5 wrist extension 4+/5 grip 4/5 FA 2+/5 Left upper extremity biceps 4/5 triceps 3+/5W EE 4 -/5 grip 3+/5 FA 2/5 Lower extremities trace movement in hip flexors/knee extension on left lower extremity but otherwise 0/5 Neurologic.  Alert oriented x 3 speech dysarthric but intelligible  He/She  has had improvement in activity tolerance, balance, postural control as well as ability to compensate for deficits. He/She has had improvement in functional use RUE/LUE  and RLE/LLE as well as improvement in awareness.  Performed holds of tidal tank for sitting balance and core strength.  Supervision wheelchair mobility navigates 3 inch curb with supervision.  All transfers with supervision.  Full teaching completed plan discharge to home       Disposition: Discharge to home    Diet: Regular  Special Instructions: No driving smoking or alcohol  Medications at discharge 1.  Tylenol as needed 2.  Vitamin B12 1000 mcg p.o. daily 3.  Neurontin 300 mg p.o. twice daily 4.  Advil 600 mg every 6 hours as needed 5.  Multivitamin daily 6.Cyanocobalamin 1000 mcg daily   30-35 minutes were spent completing discharge summary and discharge planning  Discharge Instructions     Ambulatory referral to Occupational Therapy   Complete by: As directed    Eval and treat   Ambulatory referral to Physical Therapy    Complete by:  As directed    Eval and treat        Follow-up Information     Lovorn, Aundra Millet, MD Follow up.   Specialty: Physical Medicine and Rehabilitation Why: only as needed Contact information: [address] [place] [address] [phone]         Nita Sickle K, DO Follow up.   Specialty: Neurology Why: Call for appointment Contact information: [address] [place] [address] [phone]                 Signed: Mcarthur Rossetti Pasha Gadison 10/26/2022, 4:53 AM

## 2022-10-24 NOTE — Plan of Care (Signed)
  Problem: RH SAFETY Goal: RH STG ADHERE TO SAFETY PRECAUTIONS W/ASSISTANCE/DEVICE Description: STG Adhere to Safety Precautions With cues Assistance/Device. Outcome: Progressing   Problem: RH PAIN MANAGEMENT Goal: RH STG PAIN MANAGED AT OR BELOW PT'S PAIN GOAL Description: < 4 with prns Outcome: Progressing

## 2022-10-24 NOTE — Progress Notes (Signed)
NIF > -40  VC 0.92   Pt performed with great effort.

## 2022-10-25 ENCOUNTER — Other Ambulatory Visit (HOSPITAL_COMMUNITY): Payer: Self-pay

## 2022-10-25 DIAGNOSIS — G610 Guillain-Barre syndrome: Secondary | ICD-10-CM | POA: Diagnosis not present

## 2022-10-25 MED ORDER — ADULT MULTIVITAMIN W/MINERALS CH: 1.0000 | ORAL_TABLET | Freq: Every day | ORAL | Status: AC

## 2022-10-25 MED ORDER — CYANOCOBALAMIN 1000 MCG PO TABS
1000.0000 ug | ORAL_TABLET | Freq: Every day | ORAL | 0 refills | Status: AC
Start: 2022-10-25 — End: 2023-02-02
  Filled 2022-10-25: qty 100, 100d supply, fill #0

## 2022-10-25 MED ORDER — GABAPENTIN 300 MG PO CAPS
300.0000 mg | ORAL_CAPSULE | Freq: Two times a day (BID) | ORAL | 0 refills | Status: AC
  Filled 2022-10-25: qty 60, 30d supply, fill #0

## 2022-10-25 MED ORDER — ACETAMINOPHEN 325 MG PO TABS: 650.0000 mg | ORAL_TABLET | Freq: Four times a day (QID) | ORAL | Status: AC | PRN

## 2022-10-25 NOTE — Progress Notes (Signed)
Occupational Therapy Session Note  Patient Details  Name: Alexander Mcgrath MRN: [id number] Date of Birth: 1986/02/12  Today's Date: 10/25/2022 OT Individual Time: 1019-1100 OT Individual Time Calculation (min): 41 min    Short Term Goals: Week 1:  OT Short Term Goal 1 (Week 1): STG=LTG  Skilled Therapeutic Interventions/Progress Updates:  Pt greeted seated in w/c, pt agreeable to OT intervention. Pt completed w/c propulsion to gym MODI. Pt completed various therapeutic activities with a focus on intrinsic hand strength and BUE FMC. Activities included duplicating shape structures with an emphasis on [facility], graded task up and use tweezers to place geometric shapes in correct position. Pt additionally able to complete puzzle with small pieces to focus on [facility] for ADL participation, graded task up and had pt return pieces using tweezers to pick up small pieces for increased hand strengthening.   Worked on BUE strengthening with pt grasping various weighted balls and transitioning balls from mat table to [facility] bench positioned at shoulder height to simulate higher level IADL tasks. Balls ranged from 1 lb- 5 lbs. Pt completed task with supervision. Pt additionally completed BUE strengthening circuit with 4 lb weights with pt completing 2x10 reps of bicep curls, front raise to 90* shoulder flexion and OH presses.   Ended session with pt tossing basketball to trampoline for 3 mins total to facilitate improved global strength and endurance. Pt completed w/c propulsion back to room, pt left up in w/c in room, pt MODI in room.   Therapy Documentation Precautions:  Precautions Precautions: Fall Restrictions Weight Bearing Restrictions: Yes Other Position/Activity Restrictions: no use of lower LEs    Pain: Pain Assessment Pain Scale: 0-10 Pain Score: 0-No pain    Therapy/Group: Individual Therapy  Pollyann Glen [facility] 10/25/2022, 12:11 PM

## 2022-10-25 NOTE — Progress Notes (Signed)
Inpatient Rehabilitation Discharge Medication Review by a Pharmacist  A complete drug regimen review was completed for this patient to identify any potential clinically significant medication issues.  High Risk Drug Classes Is patient taking? Indication by Medication  Antipsychotic No   Anticoagulant No   Antibiotic No   Opioid No   Antiplatelet No   Hypoglycemics/insulin No   Vasoactive Medication No   Chemotherapy No   Other Yes Tylenol PRN pain, multivitamin and cyanocobalamin/vitamin B-12 for deficiency,  gabapentin for neuropathic pain, ibuprofen PRN pain     Type of Medication Issue Identified Description of Issue Recommendation(s)  Drug Interaction(s) (clinically significant)     Duplicate Therapy     Allergy     No Medication Administration End Date     Incorrect Dose     Additional Drug Therapy Needed     Significant med changes from prior encounter (inform family/care partners about these prior to discharge).    Other       Clinically significant medication issues were identified that warrant physician communication and completion of prescribed/recommended actions by midnight of the next day:  No    Pharmacist comments: patient on melatonin inpatient for sleep  Time spent performing this drug regimen review (minutes):  20 minutes   Ruben Im, PharmD Clinical Pharmacist 10/25/2022 2:27 PM Please check AMION for all [facility] Pharmacy numbers

## 2022-10-25 NOTE — Progress Notes (Signed)
PROGRESS NOTE   Subjective/Complaints:  Pt reports 2-3 Bms after sorbitol this weekend.  Getting a similar manual w/c that he has-   No other issues  ROS   Pt denies SOB, abd pain, CP, N/V/C/D, and vision changes   Except for HPI  Objective:   No results found. No results for input(s): "WBC", "HGB", "HCT", "PLT" in the last 72 hours.  No results for input(s): "NA", "K", "CL", "CO2", "GLUCOSE", "BUN", "CREATININE", "CALCIUM" in the last 72 hours.   Intake/Output Summary (Last 24 hours) at 10/25/2022 0827 Last data filed at 10/25/2022 0707 Gross per 24 hour  Intake 360 ml  Output 1650 ml  Net -1290 ml        Physical Exam: Vital Signs Blood pressure 111/84, pulse 75, temperature 98.2 F (36.8 C), temperature source Oral, resp. rate 18, height 5\' 1"  (1.549 m), weight 37.1 kg, SpO2 95%.    General: awake, alert, appropriate, sitting up in w/c- legs crossed; NAD HENT: conjugate gaze; oropharynx moist CV: regular rate and rhythm; no JVD Pulmonary: CTA B/L; no W/R/R- good air movement GI: soft, NT, ND, (+)BS- slightly hypoactive Psychiatric: appropriate Neurological: Ox3  Post polio in legs-  Musculoskeletal:     Cervical back: Neck supple. No tenderness.     Comments: RUE- biceps 4+/5' Triceps 4+/5; WE 4+/5; grip 4/5 and FA 2+/5 LUE- biceps 4/5; Triceps 3+/5; WE 4-/5; Grip 3+/5 and FA 2/5 LE's-t race movement in HF/KE- on LLE- but otherwise 0/5 Muscle atrophy of B/L LE's- due to polio  Skin:    General: Skin is warm and dry.     Comments: No skin breakdown seen  Neurological:     Mental Status: He is alert.     Comments: Patient is alert.  No acute distress.  Sitting up in bed with father at bedside.  Speech is a bit dysarthric but intelligible.  Oriented x 3. 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 Functional deficits which require  3+ hours per day of interdisciplinary therapy in a comprehensive inpatient rehab setting. Physiatrist is providing close team supervision and 24 hour management of active medical problems listed below. Physiatrist and rehab team continue to assess barriers to discharge/monitor patient progress toward functional and medical goals  Care Tool:  Bathing    Body parts bathed by patient: Right arm, Left arm, Chest, Abdomen, Front perineal area, Buttocks, Right upper leg, Left upper leg, Face, Right lower leg, Left lower leg         Bathing assist Assist Level: Independent with assistive device     Upper Body Dressing/Undressing Upper body dressing   What is the patient wearing?: Pull over shirt    Upper body assist Assist Level: Independent    Lower Body Dressing/Undressing Lower body dressing      What is the patient wearing?: Underwear/pull up, Pants     Lower body assist Assist for lower body dressing: Independent with assitive device     Toileting Toileting    Toileting assist Assist for toileting: Independent with assistive device     Transfers Chair/bed transfer  Transfers assist  Chair/bed transfer assist level: Minimal Assistance - Patient > 75%     Locomotion Ambulation   Ambulation assist   Ambulation activity did not occur: N/A          Walk 10 feet activity   Assist  Walk 10 feet activity did not occur: N/A        Walk 50 feet activity   Assist Walk 50 feet with 2 turns activity did not occur: N/A         Walk 150 feet activity   Assist Walk 150 feet activity did not occur: N/A         Walk 10 feet on uneven surface  activity   Assist Walk 10 feet on uneven surfaces activity did not occur: N/A         Wheelchair     Assist Is the patient using a wheelchair?: Yes Type of Wheelchair: Manual    Wheelchair assist level: Independent Max wheelchair distance: 500    Wheelchair 50 feet with 2 turns  activity    Assist        Assist Level: Independent   Wheelchair 150 feet activity     Assist      Assist Level: Independent   Blood pressure 111/84, pulse 75, temperature 98.2 F (36.8 C), temperature source Oral, resp. rate 18, height 5\' 1"  (1.549 m), weight 37.1 kg, SpO2 95%.  Medical Problem List and Plan: 1. 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  Completed IVIG.             -patient may  shower             -ELOS/Goals: 16-20 days- mod I at w/c level  D/c 8/20  Con't CIR PT and OT Fitted for new w/c 2.  Antithrombotics: -DVT/anticoagulation:  Mechanical: Antiembolism stockings, thigh (TED hose)  Bilateral lower extremities  8/16- pt in w/c at all times- likelihood of DVTs is extremely low since at level of function. Doesn't want lovenox             -antiplatelet therapy: N/A 3. Pain Management: Neurontin 300 mg twice daily, Advil as needed 4. Mood/Behavior/Sleep: Melatonin nightly, Atarax as needed anxiety.  Provide emotional support             -antipsychotic agents: N/A 5. Neuropsych/cognition: This patient is capable of making decisions on his own behalf. 6. Skin/Wound Care: Routine skin checks 7. Fluids/Electrolytes/Nutrition: Routine in and outs with follow-up chemistries. 8. Chronic constipation- LBM 1 week ago- he doesn't think he needs assistance with this  8/14- will order miralax prn if pt needs it- if no BM in next 1-2 days, will need sorbitol- educated pt that AIDP causes slowing of gut as well as weakness-causing. 8/15- no BM yet- wants ot wait on Sorbitol per pt- 8/16- no BM yet- needs to have sorbitol 15cc- after therapy. Since no BM for   10 days now 8/19- had 3 Bms Saturday 9. W/C- is 37 years old- will see if pt wants [facility] to do new w/c eval while in-pt.   8/15- getting w/c eval today or tomorrow- pt agrees wants ot do- his own w/c has no brakes as well. 8/16- got brakes, but  tight and keeps hitting them- different configuration- also fitted for new w/c  8/19- spoke to w/c rep- pt getting similar w/c that he currently has.  10. 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   I spent a total of  35   minutes on total care today- >50% coordination of care- due to  D/c W/C rep about new w/c- also d/w OT about progress- ready for d/c tomorrow   LOS: 6 days A FACE TO FACE EVALUATION WAS PERFORMED  Harim Bi 10/25/2022, 8:27 AM

## 2022-10-25 NOTE — Progress Notes (Signed)
Occupational Therapy Session Note  Patient Details  Name: Alexander Mcgrath MRN: [id number] Date of Birth: [birth year]  Today's Date: 10/25/2022 OT Individual Time: 0700-0810 OT Individual Time Calculation (min): 70 min    Short Term Goals: Week 1:  OT Short Term Goal 1 (Week 1): STG=LTG  Skilled Therapeutic Interventions/Progress Updates:    Pt resting in bed upon arrival. OT intervention with focus on functional transfers, sitting balance, FMC tasks, BUE strengthening, discharge planning, and safety awareness. All transfers with mod I. Initial focus on BUE/UB strengtheing with 1kg ball and Tidal Wave (5#). Pt demonstrated home routine with 1kg ball. BUE circuit with Tidal pushing out and up while seated EOM. Grip strengthening with Hand Exerciser. [facility] tasks with small beads and theraputty.  9 Hole Peg Test is used to measure finger dexterity in pts with various neurological diagnoses. - Instructions The pt was instructed to pick up the pegs one at a time, using their dominant hand first and put them into the holes in any order until the holes were all filled. The pt then removed the pegs one at a time and returned them to the container. Both hands were tested separately.  - Results The pt completed the test in 27.8 seconds with RUE and 27.07 with LUE Scores are based on the time taken to complete the activity. The timer started the moment the pt touched the first peg until the moment the last peg hit the container.  - Norms for healthy males ages 29-70+ 59-55 R 18.9 L 19.84 56-60 R 20.90 L 21.64 61-65 R 20.87 L 21.60 66-70 R 21.23 L 22.29 71+ R 25.79 L 25.95  Pt returned to room. Pt mod I in room. Sign posted and staff notified.    Therapy Documentation Precautions:  Precautions Precautions: Fall Restrictions Weight Bearing Restrictions: Yes Other Position/Activity Restrictions: no use of lower LEs   Pain:  Pt denies pain   Therapy/Group: Individual  Therapy  Rich Brave 10/25/2022, 8:19 AM

## 2022-10-25 NOTE — Progress Notes (Signed)
Occupational Therapy Discharge Summary  Patient Details  Name: Alexander Mcgrath MRN: [id number] Date of Birth: [birth year]  Date of Discharge from OT service:October 25, 2022  Patient has met 7 of 7 long term goals due to {due to:3041651}.  Pt made excellent progress with BADLs and transfers. Pt is mod I for BADLs and transfers. Pt is mod I at w/c level. Pt will be living independently in his home after discharge. Patient to discharge at overall {LOA:3049010} level.  Patient's care partner {care partner:3041650} to provide the necessary {assistance:3041652} assistance at discharge.    Reasons goals not met: n/a  Recommendation:  Patient will benefit from ongoing skilled OT services in {setting:3041680} to continue to advance functional skills in the area of {ADL/iADL:3041649}.  Equipment: No equipment provided  Reasons for discharge: {Reason for discharge:3049018}  Patient/family agrees with progress made and goals achieved: {Pt/Family agree with progress/goals:3049020}  OT Discharge ADL ADL Eating: Independent Grooming: Modified independent Upper Body Bathing: Modified independent Where Assessed-Upper Body Bathing: Shower Lower Body Bathing: Modified independent Where Assessed-Lower Body Bathing: Shower Upper Body Dressing: Independent Where Assessed-Upper Body Dressing: Wheelchair Lower Body Dressing: Modified independent Where Assessed-Lower Body Dressing: Wheelchair Toileting: Modified independent Where Assessed-Toileting: Neurosurgeon Method: Forensic scientist: Modified independent Web designer Method: Conservation officer, historic buildings: Information systems manager without back Film/video editor: Administrator, arts Method: Administrator: Sales promotion account executive Baseline Vision/History: 1 Wears glasses Patient Visual Report: No change from baseline Vision  Assessment?: No apparent visual deficits Perception  Perception: Within Functional Limits Praxis Praxis: WFL Cognition Cognition Overall Cognitive Status: Within Functional Limits for tasks assessed Arousal/Alertness: Awake/alert Orientation Level: Person;Place;Situation Person: Oriented Place: Oriented Situation: Oriented Memory: Appears intact Attention: Focused;Sustained;Selective Focused Attention: Appears intact Sustained Attention: Appears intact Selective Attention: Appears intact Awareness: Appears intact Problem Solving: Appears intact Safety/Judgment: Appears intact Brief Interview for Mental Status (BIMS) Repetition of Three Words (First Attempt): 3 Temporal Orientation: Year: Correct Temporal Orientation: Month: Accurate within 5 days Temporal Orientation: Day: Correct Recall: "Sock": Yes, no cue required Recall: "Blue": Yes, no cue required Recall: "Bed": Yes, no cue required BIMS Summary Score: 15 Sensation Sensation Light Touch: Impaired Detail Central sensation comments: numbness/tingling in B hands Hot/Cold: Appears Intact Proprioception: Appears Intact Stereognosis: Not tested Coordination Gross Motor Movements are Fluid and Coordinated: Yes Fine Motor Movements are Fluid and Coordinated: Yes Motor  Motor Motor: Paraplegia;Abnormal tone;Abnormal postural alignment and control Motor - Skilled Clinical Observations: baseline paraplegia, impaired dynamic sitting balance    Trunk/Postural Assessment  Cervical Assessment Cervical Assessment: Within Functional Limits Thoracic Assessment Thoracic Assessment: Exceptions to [facility] (Lt rib flaring with spinal curvature) Lumbar Assessment Lumbar Assessment: Within Functional Limits Postural Control Postural Control: Deficits on evaluation Trunk Control: decreased but improving  Balance Static Sitting Balance Static Sitting - Balance Support: Feet supported Static Sitting - Level of Assistance: 6: Modified  independent (Device/Increase time) Dynamic Sitting Balance Dynamic Sitting - Balance Support: During functional activity Dynamic Sitting - Level of Assistance: 6: Modified independent (Device/Increase time) Extremity/Trunk Assessment RUE Assessment RUE Assessment: Within Functional Limits General Strength Comments: numbness in hands LUE Assessment LUE Assessment: Within Functional Limits General Strength Comments: numbness in hands   Rich Brave 10/25/2022, 6:58 AM

## 2022-10-25 NOTE — Progress Notes (Signed)
Physical Therapy Discharge Summary  Patient Details  Name: Alexander Mcgrath MRN: [id number] Date of Birth: June 25, 1985  Date of Discharge from PT service:October 25, 2022  Today's Date: 10/25/2022 PT Individual Time: 7564-3329 PT Individual Time Calculation (min): 52 min    Patient has met 5 of 5 long term goals due to improved activity tolerance, improved balance, improved postural control, increased strength, ability to compensate for deficits, improved attention, improved awareness, and improved coordination.  Patient to discharge at a wheelchair level Modified Independent.      Recommendation:  Patient will benefit from ongoing skilled PT services in outpatient setting to continue to advance safe functional mobility, address ongoing impairments in strength, balance, and minimize fall risk.  Equipment: Custom WC   Reasons for discharge: treatment goals met and discharge from hospital  Patient/family agrees with progress made and goals achieved: Yes  PT Discharge Precautions/Restrictions Precautions Precautions: Fall Restrictions Weight Bearing Restrictions: Yes Other Position/Activity Restrictions: no use of lower LEs Pain Interference Pain Interference Pain Effect on Sleep: 1. Rarely or not at all Pain Interference with Therapy Activities: 1. Rarely or not at all Pain Interference with Day-to-Day Activities: 1. Rarely or not at all Vision/Perception  Vision - History Ability to See in Adequate Light: 0 Adequate Perception Perception: Within Functional Limits Praxis Praxis: WFL  Cognition Overall Cognitive Status: Within Functional Limits for tasks assessed Arousal/Alertness: Awake/alert Orientation Level: Oriented X4 Focused Attention: Appears intact Sustained Attention: Appears intact Selective Attention: Appears intact Memory: Appears intact Awareness: Appears intact Problem Solving: Appears intact Safety/Judgment: Appears  intact Sensation Sensation Light Touch: Impaired Detail Central sensation comments: numbness/tingling in B hands Peripheral sensation comments: pt able to detect light touch in all dermatomes on B LE, but diminished on R LE Light Touch Impaired Details: Impaired RLE Coordination Gross Motor Movements are Fluid and Coordinated: Yes Fine Motor Movements are Fluid and Coordinated: Yes Motor  Motor Motor: Paraplegia;Abnormal tone;Abnormal postural alignment and control Motor - Skilled Clinical Observations: baseline paraplegia, impaired dynamic sitting balance  Mobility Bed Mobility Bed Mobility: Supine to Sit;Sit to Supine Supine to Sit: Independent Sit to Supine: Independent Transfers Transfers: Lateral/Scoot Transfers Lateral/Scoot Transfers: Independent Transfer (Assistive device): None Locomotion  Gait Ambulation: No Gait Gait: No Stairs / Additional Locomotion Stairs: No Corporate treasurer: Yes Wheelchair Assistance: Independent with Scientist, research (life sciences): Both upper extremities Wheelchair Parts Management: Independent Distance: 300+  Trunk/Postural Assessment  Cervical Assessment Cervical Assessment: Within Functional Limits Thoracic Assessment Thoracic Assessment: Exceptions to [facility] (Lt rib flaring with spinal curvature) Lumbar Assessment Lumbar Assessment: Within Functional Limits Postural Control Postural Control: Deficits on evaluation Trunk Control: decreased but improving  Balance Balance Balance Assessed: Yes Static Sitting Balance Static Sitting - Balance Support: Feet supported Static Sitting - Level of Assistance: 6: Modified independent (Device/Increase time) Dynamic Sitting Balance Dynamic Sitting - Balance Support: During functional activity Dynamic Sitting - Level of Assistance: 6: Modified independent (Device/Increase time) Sitting balance - Comments: LEs are not able to provide good support due to  paralysis Extremity Assessment  RLE Assessment General Strength Comments: at baseline has no movement in R LE (paraplegia) - is flaccid LLE Assessment LLE Assessment: Exceptions to [facility] Passive Range of Motion (PROM) Comments: WFL/WNL General Strength Comments: reports no change from baseline strength (paraplegia) can wiggle toes a little and has possible slight hip rotation movement but otherwise flaccid  Pt seated in WC upon arrival. Pt agreeable to therapy. Pt denies any pain.   Pt performed all discharge components  with mod I. Pt performed 5 minutes on nustep with B UE only with at work load level 4 for total of 369 steps, verbal cues provided for technique.   Pt self propelled WC over threshold of elevator to first floor, outside up and down ramp, with mod I, for strengthening.   Pt performed the following therex for UE strengthening  Rows x20 with red TB  Bicep curls with 4# dumbbell  Overhead tricep extensions x15 B with 3# dumbbell with R UE and no weight on L UE with therapist stabilizing L UE above head for proper form  1x15 shoulder press with 3# dumbbell   1x15 chest press with 3# dumbbell   Pt seated in WC at end of session. Pt mod I in room.    [facility] Ramos, [place], DPT  10/25/2022, 11:50 AM

## 2022-10-25 NOTE — Progress Notes (Signed)
Occupational Therapy Session Note  Patient Details  Name: Alexander Mcgrath MRN: [id number] Date of Birth: 1985/11/03  Today's Date: 10/25/2022 OT Individual Time: 1405-1430 OT Individual Time Calculation (min): 25 min    Short Term Goals: Week 1:  OT Short Term Goal 1 (Week 1): STG=LTG  Skilled Therapeutic Interventions/Progress Updates:    OT intervention with focus on w/c mobility and home safety education. Pt mod I in room at w/c level. Reviewed home safety recommendations. Pt's home previously adapted for his use at w/c level. Pt states he will be staying with his parents for a couple of days prior to moving back to his home where he lives independently. Pt pleased with progress and ready to get back to his life. All needs within reach.   Therapy Documentation Precautions:  Precautions Precautions: Fall Restrictions Weight Bearing Restrictions: Yes Other Position/Activity Restrictions: no use of lower LEs Pain:  Pt denies pain this afternoon   Therapy/Group: Individual Therapy  Rich Brave 10/25/2022, 2:31 PM

## 2022-10-25 NOTE — Progress Notes (Signed)
Patient ID: Alexander Mcgrath, male   DOB: 12/16/1985, 37 y.o.   MRN: [id number]  SW met with pt during OT session to discuss d/c recs for outpatient PT/OT. Pt prefers [facility] location (p:[phone]/f:[phone]).  Cecile Sheerer, MSW, LCSWA Office: [phone] Cell: [phone] Fax: [phone]

## 2022-10-25 NOTE — Plan of Care (Signed)
  Problem: RH Balance Goal: LTG Patient will maintain dynamic sitting balance (PT) Description: LTG:  Patient will maintain dynamic sitting balance with assistance during mobility activities (PT) Outcome: Completed/Met Flowsheets (Taken 10/25/2022 1230) LTG: Pt will maintain dynamic sitting balance during mobility activities with:: Independent with assistive device    Problem: RH Bed Mobility Goal: LTG Patient will perform bed mobility with assist (PT) Description: LTG: Patient will perform bed mobility with assistance, with/without cues (PT). Outcome: Completed/Met Flowsheets (Taken 10/25/2022 1230) LTG: Pt will perform bed mobility with assistance level of: Independent with assistive device    Problem: RH Bed to Chair Transfers Goal: LTG Patient will perform bed/chair transfers w/assist (PT) Description: LTG: Patient will perform bed to chair transfers with assistance (PT). Outcome: Completed/Met   Problem: RH Car Transfers Goal: LTG Patient will perform car transfers with assist (PT) Description: LTG: Patient will perform car transfers with assistance (PT). Outcome: Completed/Met Flowsheets (Taken 10/25/2022 1230) LTG: Pt will perform car transfers with assist:: Supervision/Verbal cueing   Problem: RH Floor Transfers Goal: LTG Patient will perform floor transfers w/assist (PT) Description: LTG: Patient will perform floor transfers with assistance (PT). Outcome: Completed/Met Flowsheets (Taken 10/25/2022 1230) LTG: PT WILL PERFORM FLOOR TRANFERS  WITH  ASSIST:: Supervision/Verbal cueing

## 2022-10-26 DIAGNOSIS — G610 Guillain-Barre syndrome: Secondary | ICD-10-CM | POA: Diagnosis not present

## 2022-10-26 NOTE — Progress Notes (Signed)
Pt performed NIF/VC w/ great effort  VC: 1.36 L NIF: > -40 cmH2O

## 2022-10-26 NOTE — Progress Notes (Signed)
PROGRESS NOTE   Subjective/Complaints:  Ready for d/c Wants to see if can wean off gabapentin- we discussed going to 300 mg at bedtime x 1-2 weeks then stop- if tolerated.   Wants a note to restart work on 11/09/22.   ROS   Pt denies SOB, abd pain, CP, N/V/C/D, and vision changes   Except for HPI  Objective:   No results found. No results for input(s): "WBC", "HGB", "HCT", "PLT" in the last 72 hours.  No results for input(s): "NA", "K", "CL", "CO2", "GLUCOSE", "BUN", "CREATININE", "CALCIUM" in the last 72 hours.   Intake/Output Summary (Last 24 hours) at 10/26/2022 1610 Last data filed at 10/25/2022 1806 Gross per 24 hour  Intake 480 ml  Output 900 ml  Net -420 ml        Physical Exam: Vital Signs Blood pressure 105/72, pulse 70, temperature 97.9 F (36.6 C), temperature source Oral, resp. rate 16, height 5\' 1"  (1.549 m), weight 37.1 kg, SpO2 99%.     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 No tenderness.     Comments: RUE- biceps 4+/5' Triceps 4+/5; WE 4+/5; grip 4/5 and FA 2+/5 LUE- biceps 4/5; Triceps 3+/5; WE 4-/5; Grip 3+/5 and FA 2/5 LE's-t race movement in HF/KE- on LLE- but otherwise 0/5 Muscle atrophy of B/L LE's- due to polio  Skin:    General: Skin is warm and dry.     Comments: No skin breakdown seen  Neurological:     Mental Status: He is alert.     Comments: Patient is alert.  No acute distress.  Sitting up in bed with father at bedside.  Speech is a bit dysarthric but intelligible.  Oriented x 3. 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  Functional deficits which require 3+ hours per day of interdisciplinary therapy in a comprehensive inpatient rehab setting. Physiatrist is providing close team supervision and 24 hour management of active medical problems listed below. Physiatrist and rehab team continue to assess barriers to discharge/monitor patient progress toward functional and medical goals  Care Tool:  Bathing    Body parts bathed by patient: Right arm, Left arm, Chest, Abdomen, Front perineal area, Buttocks, Right upper leg, Left upper leg, Face, Right lower leg, Left lower leg         Bathing assist Assist Level: Independent with assistive device     Upper Body Dressing/Undressing Upper body dressing   What is the patient wearing?: Pull over shirt    Upper body assist Assist Level: Independent    Lower Body Dressing/Undressing Lower body dressing      What is the patient wearing?: Underwear/pull up, Pants     Lower body assist Assist for lower body dressing: Independent with assitive device     Toileting Toileting    Toileting assist Assist for toileting: Independent with assistive device  Transfers Chair/bed transfer  Transfers assist  Chair/bed transfer activity did not occur: N/A  Chair/bed transfer assist level: Independent with assistive device     Locomotion Ambulation   Ambulation assist   Ambulation activity did not occur: N/A          Walk 10 feet activity   Assist  Walk 10 feet activity did not occur: N/A        Walk 50 feet activity   Assist Walk 50 feet with 2 turns activity did not occur: N/A         Walk 150 feet activity   Assist Walk 150 feet activity did not occur: N/A         Walk 10 feet on uneven surface  activity   Assist Walk 10 feet on uneven surfaces activity did not occur: N/A         Wheelchair     Assist Is the patient using a wheelchair?: Yes Type of Wheelchair: Manual    Wheelchair assist level:  Independent Max wheelchair distance: 500    Wheelchair 50 feet with 2 turns activity    Assist        Assist Level: Independent   Wheelchair 150 feet activity     Assist      Assist Level: Independent   Blood pressure 105/72, pulse 70, temperature 97.9 F (36.6 C), temperature source Oral, resp. rate 16, height 5\' 1"  (1.549 m), weight 37.1 kg, SpO2 99%.  Medical Problem List and Plan: 1. 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  Completed IVIG.             -patient may  shower             -ELOS/Goals: 16-20 days- mod I at w/c level  D/c 8/20  D/c today Fitted for new w/c 2.  Antithrombotics: -DVT/anticoagulation:  Mechanical: Antiembolism stockings, thigh (TED hose)  Bilateral lower extremities  8/16- pt in w/c at all times- likelihood of DVTs is extremely low since at level of function. Doesn't want lovenox             -antiplatelet therapy: N/A 3. Pain Management: Neurontin 300 mg twice daily, Advil as needed 4. Mood/Behavior/Sleep: Melatonin nightly, Atarax as needed anxiety.  Provide emotional support             -antipsychotic agents: N/A 5. Neuropsych/cognition: This patient is capable of making decisions on his own behalf. 6. Skin/Wound Care: Routine skin checks 7. Fluids/Electrolytes/Nutrition: Routine in and outs with follow-up chemistries. 8. Chronic constipation- LBM 1 week ago- he doesn't think he needs assistance with this  8/14- will order miralax prn if pt needs it- if no BM in next 1-2 days, will need sorbitol- educated pt that AIDP causes slowing of gut as well as weakness-causing. 8/15- no BM yet- wants ot wait on Sorbitol per pt- 8/16- no BM yet- needs to have sorbitol 15cc- after therapy. Since no BM for   10 days now 8/19- had 3 Bms Saturday 9. W/C- is 37 years old- will see if pt wants [facility] to do new w/c eval while in-pt.   8/15- getting w/c eval today or tomorrow- pt agrees  wants ot do- his own w/c has no brakes as well. 8/16- got brakes, but tight and keeps hitting them- different configuration- also fitted for new w/c  8/19- spoke to w/c rep- pt getting similar w/c that he currently has.  10. 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    I spent a total of 34    minutes on total care today- >50% coordination of care- due to  D/w PA about d/c plans- education pt on weaning off Gabapentin. And writing letter for pt to return to work on 11/09/22  LOS: 7 days A FACE TO FACE EVALUATION WAS PERFORMED  Alexander Mcgrath 10/26/2022, 8:22 AM

## 2022-10-27 NOTE — Progress Notes (Signed)
Inpatient Rehabilitation Care Coordinator Discharge Note   Patient Details  Name: Alexander Mcgrath MRN: [id number] Date of Birth: 27-Jan-1986   Discharge location: D/c tohome with parents for a few days, and then will return to his home  Length of Stay: 6 days  Discharge activity level: Mod I at w/c level  Home/community participation: Limited  Patient response WU:JWJXBJ Literacy - How often do you need to have someone help you when you read instructions, pamphlets, or other written material from your doctor or pharmacy?: Never  Patient response YN:WGNFAO Isolation - How often do you feel lonely or isolated from those around you?: Never  Services provided included: MD, RD, PT, OT, RN, CM, TR, Pharmacy, Neuropsych, SW  Financial Services:  Field seismologist Utilized: [employer]  Choices offered to/list presented to: patient  Follow-up services arranged:  Outpatient, DME    Outpatient Servicies: [facility] locaiton for PT/OT DME : [facility] for specialty w/c    Patient response to transportation need: Is the patient able to respond to transportation needs?: Yes In the past 12 months, has lack of transportation kept you from medical appointments or from getting medications?: No In the past 12 months, has lack of transportation kept you from meetings, work, or from getting things needed for daily living?: No   Patient/Family verbalized understanding of follow-up arrangements:  Yes  Individual responsible for coordination of the follow-up plan: contact pt [phone]  Confirmed correct DME delivered: Gretchen Short 10/27/2022    Comments (or additional information):fam edu completed  Summary of Stay    Date/Time Discharge Planning CSW  10/22/22 1100 Pt will d/c to home toa family member's home or his home if he can be Mod I at d/c. SW will confirm there are no barriers to discharge. AAC       Colen Eltzroth A Lula Olszewski

## 2022-11-03 NOTE — Progress Notes (Signed)
Initial neurology clinic note  Reason for Evaluation: Consultation requested by Alexander Councilman, MD for an opinion regarding acute onset numbness and weakness. My final recommendations will be communicated back to the requesting physician by way of shared medical record or letter to requesting physician via [place] mail.  HPI: This is [name], a 38 y.o. 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 The patient is accompanied by parents, Alexander Mcgrath (father) and Alexander Mcgrath (mother).  Patient's baseline before recent illness was wheelchair bound, but independent of transfers, driving, working as a Psychologist, occupational.  Patient first presented to the ED on 10/08/22 for numbness in the left leg and numbness and tingling in bilateral hands. There was also some increase in weakness though patient is wheelchair bound, so extent at that time was unclear. His strength in bilateral UE was 5/5 and cranial nerves were intact per documentation that day. CT head was unremarkable. Given that he worked as a Psychologist, occupational, the thought was it was working with his hands that was causing symptoms. Patient was discharged home.  He represented to the ED on 10/12/22 for ascending weakness. He noticed he was no longer able to transfer himself into his wheelchair. Per documentation, he was having difficulty communicating and left eye was "fuzzy" (blurry vision). He was unable to use facial muscles to smile. He had a hard time talking and swallowing. His upper extremities were now weak as well. There was concern for AIDP, nutritional deficiency, or meningitis, so patient was admitted. MRI brain and cervical spine were unremarkable. The plan was for LP, but even by IR, no CSF was able to be obtained. Given the high suspicion of AIDP, decision was made to start IVIg on 10/13/22 for 5 sessions. Patient had  improvement of strength in upper extremities prior to discharge. Patient started feeling better after the 4th treatment. Patient also had elevated Coxsackie titers, which are of unclear significance to me and were not mentioned in hospital notes that I can find (maybe all IgG and old infection?).   Patient was discharged to rehab where he got PT, OT, speech therapy. He continued to show improvement per documentation. His exam was as follows at dc from rehab: Right upper extremity biceps 4+/5 triceps 4+/5 wrist extension 4+/5 grip 4/5 FA 2+/5 Left upper extremity biceps 4/5 triceps 3+/5W EE 4 -/5 grip 3+/5 FA 2/5 Lower extremities trace movement in hip flexors/knee extension on left lower extremity but otherwise 0/5 Neurologic.  Alert oriented x 3 speech dysarthric but intelligible  Per patient, he continues to improve. He thinks his arms are much stronger. He still has a little bit of numbness and tingling in his fingers. Patient thinks his face the major area that has not improved as much. It may be improving, but not as much as his hands. He feels the right side of his face is very weak. He has no tingling and maybe not numbness. He thinks the forehead is fine, just the right lower face. He is swallowing okay. He can eat normal food, but has to be slow about it.   He denies any regression of symptoms. He has returned to work. He reports no trouble. He is driving again (hand controls). He is also returning to wheelchair basketball. He feels some weakness still while doing this.  Patient has not had similar symptoms in the past.  He is  scheduled to establish with physical and occupational therapy next week. He does not think he has speech therapy.  Of note, patient has bowel and bladder normal function at baseline. This was never affected during his illness.  EtOH use: None  Restrictive diet? Does not eat a lot of animal protein   MEDICATIONS:  Outpatient Encounter Medications as of 11/12/2022   Medication Sig   acetaminophen (TYLENOL) 325 MG tablet Take 2 tablets (650 mg total) by mouth every 6 (six) hours as needed for mild pain, moderate pain or headache.   cyanocobalamin 1000 MCG tablet Take 1 tablet (1,000 mcg total) by mouth daily.   gabapentin (NEURONTIN) 300 MG capsule Take 1 capsule (300 mg total) by mouth 2 (two) times daily. (Patient taking differently: Take 300 mg by mouth once.)   ibuprofen (ADVIL) 200 MG tablet Take 400 mg by mouth every 6 (six) hours as needed for headache or moderate pain.   Multiple Vitamin (MULTIVITAMIN WITH MINERALS) TABS tablet Take 1 tablet by mouth daily.   No facility-administered encounter medications on file as of 11/12/2022.    PAST MEDICAL HISTORY: Past Medical History:  Diagnosis Date   Polio    TB of vertebral column     PAST SURGICAL HISTORY: Past Surgical History:  Procedure Laterality Date   ORIF FEMUR FRACTURE     SPINAL FUSION     TIBIA IM NAIL INSERTION Left     ALLERGIES: No Known Allergies  FAMILY HISTORY: History reviewed. No pertinent family history.  SOCIAL HISTORY: Social History   Tobacco Use   Smoking status: Never   Smokeless tobacco: Never  Vaping Use   Vaping status: Never Used  Substance Use Topics   Alcohol use: Yes    Comment: occas   Social History   Social History Narrative   Are you right handed or left handed? Right   Are you currently employed ? 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    What type of home do you live in: 1 story or 2 story? one    Caffeine 2 cups a day      OBJECTIVE: PHYSICAL EXAM: BP (!) 117/55   Pulse 68   Ht 5\' 1"  (1.549 m)   Wt 90 lb (40.8 kg)   SpO2 100%   BMI 17.01 kg/m   General: General appearance: Awake and alert. No distress. Cooperative with exam.  Skin: No obvious rash or jaundice. HEENT: Atraumatic. Anicteric. Lungs: Non-labored breathing on room air  Heart:  Regular Extremities: Atrophy of bilateral legs. Musculoskeletal: No obvious joint swelling.  Neurological: Mental Status: Alert. Speech fluent. No pseudobulbar affect Cranial Nerves: CNII: No RAPD. Visual fields grossly intact. CNIII, IV, VI: PERRL. No nystagmus. EOMI. CN V: Facial sensation intact bilaterally to fine touch. CN VII: Facial muscles diffusely weak, including frontalis, orbicularis oculi, and orbicularis oris. Patient has ~2 cm of separation of upper and lower eyelids when trying to close eyes. No ptosis at rest. CN VIII: Hearing grossly intact bilaterally. CN IX: No hypophonia. CN X: Palate elevates symmetrically. CN XI: Full strength shoulder shrug bilaterally. CN XII: Tongue protrusion full and midline. No atrophy or fasciculations. Mild dysarthria Motor: Tone is normal in upper extremities. Difficult to assess in lower extremities. No fasciculations in extremities. Atrophy of bilateral APB, FDI, and ADM.  Individual muscle group testing (MRC grade out of 5):  Movement     Neck  flexion 5    Neck extension 5     Right Left   Shoulder abduction 5 5   Shoulder adduction 5 5   Shoulder ext rotation 5 5   Shoulder int rotation 5 5   Elbow flexion 5 5   Elbow extension 5 5   Wrist extension 5 5   Wrist flexion 5 5   Finger abduction - FDI 4 4+   Finger abduction - ADM 4 4+   Finger extension 5 5   Finger distal flexion - 2/3 5 5    Finger distal flexion - 4/5 5 5    Thumb flexion - FPL 5 5   Thumb abduction - APB 4- 4-    No movement in right leg; able to twist some in left leg, but minimal  Reflexes:  Right Left   Bicep Trace Trace   Tricep Trace Trace   BrRad 1+ 1+   Knee 0 0   Ankle 0 0    Sensation: Pinprick: Intact in all extremities Coordination: Intact finger-to- nose-finger bilaterally. Gait: Unable to ambulate (wheelchair bound at baseline)  Lab and Test Review: Internal labs: 10/12/22: B12: 436 B1 wnl B6 wnl RPR and HIV  non-reactive SPEP: no M protein ESR and CRP wnl Heavy metals (arsenic, mercury, lead): wnl CMV wnl Porphyrins wnl Mg, phos wnl TSH wnl Folate wnl  Component     Latest Ref Rng 10/12/2022  Coxsackie A7 IgG     Neg:<1:100 titer 1:800 (H)   Coxsackie A9 IgG     Neg:<1:100 titer 1:800 (H)   Coxsackie A16 IgG     Neg:<1:100 titer 1:800 (H)   Coxsackie A24 IgG     Neg:<1:100 titer 1:800 (H)   Coxsackie A7 IgM     Neg:<1:10 titer Negative   Coxsackie A9 IgM     Neg:<1:10 titer Negative   Coxsackie A16 IgM     Neg:<1:10 titer Negative   Coxsackie A24 IgM     Neg:<1:10 titer Negative     Component     Latest Ref Rng 10/12/2022  COXSACKIE B1 AB     Neg:<1:100  1:100 (H)   COXSACKIE B2 AB     Neg:<1:100  1:100 (H)   COXSACKIE B3 AB     Neg:<1:100  1:100 (H)   COXSACKIE B4 AB     Neg:<1:100  1:500 (H)   COXSACKIE B5 AB     Neg:<1:100  1:1000 (H)   COXSACKIE B6 AB     Neg:<1:100  1:500 (H)     10/20/22: CMP unremarkable CBC w/ diff unremarkable  Imaging: CT head wo contrast (10/08/22): FINDINGS: Brain: There is no evidence for acute hemorrhage, hydrocephalus, mass lesion, or abnormal extra-axial fluid collection. No definite CT evidence for acute infarction.   Vascular: No hyperdense vessel or unexpected calcification.   Skull: No evidence for fracture. No worrisome lytic or sclerotic lesion.   Sinuses/Orbits: The visualized paranasal sinuses and mastoid air cells are clear. Visualized portions of the globes and intraorbital fat are unremarkable.   Other: None.   IMPRESSION: Unremarkable head CT. No acute intracranial abnormality.  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 No fracture or spondylolisthesis is noted. Severe dextroscoliosis of thoracic spine is noted. Fusion of multiple disc spaces in the lower thoracic and upper lumbar spine is noted.   IMPRESSION: Postsurgical changes  as noted above.  No acute abnormality seen.  MRI brain w/wo contrast (10/12/22): FINDINGS:  Brain: No acute infarct, mass effect or extra-axial collection. No acute or chronic hemorrhage. Normal white matter signal, parenchymal volume and CSF spaces. The midline structures are normal.   Vascular: Major flow voids are preserved.   Skull and upper cervical spine: Normal calvarium and skull base. Visualized upper cervical spine and soft tissues are normal.   Sinuses/Orbits:No paranasal sinus fluid levels or advanced mucosal thickening. No mastoid or middle ear effusion. Normal orbits.   IMPRESSION: Normal brain MRI.  MRI cervical spine w/wo contrast (10/12/22): FINDINGS: Alignment: There is mild kyphotic angulation centered at C4-5. No sagittal spondylolisthesis. The atlantodens interval is intact.   Vertebrae: Vertebral body heights are maintained. Minimal anterior C5-6 and C6-7 disc space narrowing. w within the limitations of patient motion artifact, no significant bone signal abnormality is seen.   Cord: The cervical cord demonstrates normal signal and caliber. No abnormal cord enhancement.   Posterior Fossa, vertebral arteries, paraspinal tissues: Negative.   Disc levels:   C2-3: Mild bilateral uncovertebral hypertrophy. No posterior disc bulge. No central canal or neuroforaminal stenosis.   C3-4: Mild bilateral facet joint hypertrophy. Mild left uncovertebral hypertrophy. Borderline mild left neuroforaminal stenosis. The right neural foramen is patent. No central canal stenosis.   C4-5: Mild bilateral facet joint hypertrophy. No posterior disc bulge, central canal narrowing, or neuroforaminal stenosis.   C5-6: Mild bilateral facet joint hypertrophy. Mild posterior disc bulge with mild bilateral intraforaminal extension. No significant neuroforaminal stenosis. No central canal stenosis.   C6-7: Mild posterior disc bulge. CSF is still seen ventral to the cord. No  central canal or neuroforaminal stenosis.   C7-T1: No posterior disc bulge, central canal narrowing, or neuroforaminal stenosis.   T1-2: Unremarkable.   IMPRESSION: 1. Mild multilevel degenerative disc and joint changes as above. 2. Borderline mild left C3-4 neuroforaminal stenosis. 3. No central canal stenosis.  Lumbar puncture (10/13/22): With the patient prone, the lower back was prepped with Betadine. 1% Lidocaine was used for local anesthesia. Lumbar puncture was attempted at the L3/4 level using a 20 gauge needle, unsuccessful.   Second lumbar puncture was attempted at the L4-L5 level, again unsuccessful. Dr. Reche Dixon was called to the procedure room for assistance. Dr. Reche Dixon attempted lumbar puncture at the L4-L5 level, unsuccessful. Despite optimal needle positioning (example series 4) CSF did not return.   Another lumbar puncture attempt was made at the L5-S1 level, patient developed shooting sensation in his left leg, however CSF could not be obtained. Unsuccessful fluoro guided lumbar puncture.   The patient tolerated the procedure well and there were no apparent complications.   IMPRESSION: Unsuccessful fluoro guided lumbar puncture, as detailed above. Exam complicated by extensive thoracolumbar spine hardware.   ASSESSMENT: Alexander Mcgrath is a 37 y.o. male who presents for evaluation of acute onset numbness, weakness, and difficulty swallowing. 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 His neurological examination is pertinent for bilateral significant facial weakness, atrophy and weakness of intrinsic hand muscles, and chronic paralysis of lower extremities. 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 He received 5 days of IVIg and had inpatient rehab with PT, OT, and speech therapy. His arms have improved greatly with some mild residual weakness and tingling in his  hands. He does have intrinsic hand muscle atrophy that is likely more chronic as this level of atrophy would not be expected in 1 month time. 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 His most significant deficits is his facial weakness though. While he describes it being right sided, both sides seem equally weak on exam, severely so. Patient is to start PT and OT next week. I think speech therapy would also be helpful to hopefully get some recovery of facial muscles as well.   PLAN: -PT and OT as planned -Speech therapy referral -Eye care discussed - eye drops, ointment, patching at night -Can continue gabapentin if needed for tingling -Patient to call with new or worsening symptoms  -Return to clinic in 3 months  The impression above as well as the plan as outlined below were extensively discussed with the patient (in the company of parents) who voiced understanding. All questions were answered to their satisfaction.  When available, results of the above investigations and possible further recommendations will be communicated to the patient via telephone/MyChart. Patient to call office if not contacted after expected testing turnaround time.   Total time spent reviewing records, interview, history/exam, documentation, and coordination of care on day of encounter:  75 min   Thank you for allowing me to participate in patient's care.  If I can answer any additional questions, I would be pleased to do so.  Jacquelyne Balint, MD   CC: Pcp, No No address on file  CC: Referring provider: Gordy Councilman, MD [address] [place],  [place] [address]

## 2022-11-12 ENCOUNTER — Encounter: Payer: Self-pay | Admitting: Neurology

## 2022-11-12 ENCOUNTER — Ambulatory Visit: Payer: BC Managed Care – PPO | Admitting: Neurology

## 2022-11-12 VITALS — BP 117/55 | HR 68 | Ht 61.0 in | Wt 90.0 lb

## 2022-11-12 DIAGNOSIS — R531 Weakness: Secondary | ICD-10-CM

## 2022-11-12 DIAGNOSIS — R471 Dysarthria and anarthria: Secondary | ICD-10-CM

## 2022-11-12 DIAGNOSIS — R1310 Dysphagia, unspecified: Secondary | ICD-10-CM

## 2022-11-12 DIAGNOSIS — R200 Anesthesia of skin: Secondary | ICD-10-CM | POA: Diagnosis not present

## 2022-11-12 DIAGNOSIS — G610 Guillain-Barre syndrome: Secondary | ICD-10-CM

## 2022-11-12 DIAGNOSIS — R29810 Facial weakness: Secondary | ICD-10-CM

## 2022-11-12 NOTE — Therapy (Signed)
OUTPATIENT PHYSICAL THERAPY NEURO EVALUATION   Patient Name: Alexander Mcgrath MRN: [id number] DOB:October 06, 1985, 37 y.o., male Today's Date: 11/15/2022   PCP: none REFERRING PROVIDER: Charlton Amor, PA-C  END OF SESSION:  PT End of Session - 11/15/22 1649     Visit Number 1    Number of Visits 1    Date for PT Re-Evaluation 11/15/22    Authorization Type BCBS    PT Start Time 1612    PT Stop Time 1648    PT Time Calculation (min) 36 min    Equipment Utilized During Treatment Gait belt    Activity Tolerance Patient tolerated treatment well    Behavior During Therapy WFL for tasks assessed/performed             Past Medical History:  Diagnosis Date   Polio    TB of vertebral column    Past Surgical History:  Procedure Laterality Date   ORIF FEMUR FRACTURE     SPINAL FUSION     TIBIA IM NAIL INSERTION Left    Patient Active Problem List   Diagnosis Date Noted   Malnutrition of moderate degree 10/23/2022   Difficulty coping 10/22/2022   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 Reports that he is back to normal mobility and transfers. Is in the process of getting a new manual w/c and is in contact with the w/c company. Reports that at [facility] he was a w/c user and has not used his LEs in 30 years. Unsure if he has new weakness in his LEs as he does not use them at baseline. Main concern at initial  onset was trouble with swallowing and transfers.   Pt accompanied by: self  PERTINENT HISTORY: Polio, TB of the vertebral column, femur ORIF, L tibial IM nailing, spinal fusion   PAIN:  Are you having pain? No  PRECAUTIONS: None  RED FLAGS: None   WEIGHT BEARING RESTRICTIONS: No  FALLS: Has patient fallen in last 6 months?  Reports falls 1x/month when navigating over uneven surfaces in his w/c but reports ability to transfer from  floor independently   LIVING ENVIRONMENT: Lives with: lives alone Lives in: House/apartment Stairs:  1 story house  Has following equipment at home: shower chair and 2 manual w/c  PLOF: Independent  PATIENT GOALS: no goals; reports back to baseline   OBJECTIVE:   DIAGNOSTIC FINDINGS: Cervical MRI 10/12/22: 1. Mild multilevel degenerative disc and joint changes as above. 2. Borderline mild left C3-4 neuroforaminal stenosis. 3. No central canal stenosis.  COGNITION: Overall cognitive status: Within functional limits for tasks assessed   SENSATION: Intact in B LEs   COORDINATION: Alternating pronation/supination: Intact B Alternating toe tap: unable d/t weakness  Finger to nose: intact B   MUSCLE TONE: B LEs hypotonic and atrophied    POSTURE: rigid posture   LOWER EXTREMITY ROM:     Passive  Right Eval Left Eval  Hip flexion    Hip extension    Hip abduction    Hip adduction    Hip internal rotation    Hip external rotation    Knee flexion    Knee extension 0 deg Lacking TKE d/t HS tightness   Ankle dorsiflexion    Ankle plantarflexion    Ankle inversion    Ankle eversion     (Blank rows = not tested)  LOWER EXTREMITY MMT:    MMT Right Eval Left Eval  Hip flexion No active motion in LEs No active motion in LEs  Hip extension    Hip abduction    Hip adduction    Hip internal rotation    Hip external rotation    Knee flexion    Knee extension    Ankle dorsiflexion    Ankle plantarflexion    Ankle inversion     Ankle eversion    (Blank rows = not tested)   TRANSFERS: Squat pivot w/c<>mat:  independent   FUNCTIONAL TESTS:   FIST: 43/56     TODAY'S TREATMENT:                                                                                                                              DATE: 11/15/22    PATIENT EDUCATION: Education details: exam findings, HEP for core strength to be done in w/c; 1 time visit  Person educated: Patient Education method: Explanation, Demonstration, Tactile cues, Verbal cues, and Handouts Education comprehension: verbalized understanding  HOME EXERCISE PROGRAM: Access Code: [facility] URL: [email or web address] Date: 11/15/2022 Prepared by: [facility] - Outpatient  Rehab - [facility]  Exercises - Seated Shoulder Row with Anchored Resistance  - 1 x daily - 5 x weekly - 2 sets - 10 reps - Seated Shoulder Extension and Scapular Retraction with Resistance  - 1 x daily - 5 x weekly - 2 sets - 10 reps - Seated Anti-Rotation Press With Anchored Resistance  - 1 x daily - 5 x weekly - 2 sets - 10 reps   GOALS: Goals reviewed with patient? Yes  SHORT TERM GOALS= LTGs: Target date:  11/15/22  Patient to be independent with initial HEP. Baseline: HEP initiated Goal status: MET 11/15/22  ASSESSMENT:  CLINICAL IMPRESSION:  Patient is a 37 y/o M presenting to OPPT s/p hospital admission 10/12/22-10/19/22 for AIDP with subsequent CIR 8/13-8/20. Reports that he is back to baseline mobility since hospitalization, which is w/c level. 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 Assessment today revealed limited sitting balance which patient reports was limited at baseline. Provided HEP for gentle core strengthening to be done in w/c. Patient reported understanding. 1 time visit only.    OBJECTIVE IMPAIRMENTS: decreased balance, decreased strength, and impaired flexibility.   ACTIVITY LIMITATIONS:  sitting  PARTICIPATION LIMITATIONS:  none  PERSONAL FACTORS: Age, Past/current experiences, Time since onset of injury/illness/exacerbation, and 3+ comorbidities: Polio, TB of the vertebral column, femur ORIF, L tibial IM nailing, spinal fusion   are also affecting patient's functional outcome.   REHAB POTENTIAL: Good  CLINICAL DECISION MAKING: Evolving/moderate complexity  EVALUATION COMPLEXITY: Moderate  PLAN:  PT FREQUENCY: one time visit  PT DURATION: -  PLANNED INTERVENTIONS: Therapeutic exercises, Therapeutic activity, Neuromuscular re-education, Balance training, Gait training, Patient/Family education, Self Care, and Joint mobilization  PLAN FOR NEXT SESSION: DC at this time    PHYSICAL THERAPY DISCHARGE SUMMARY  Visits from Start of Care: 1  Current functional level related to goals / functional outcomes: See above clinical impression   Remaining deficits: None- pt at baseline   Education / Equipment: HEP  Plan: Patient agrees to discharge.  Patient goals were met. Patient is being discharged due to meeting the stated rehab goals.        Anette Guarneri, PT, DPT 11/15/22 5:00 PM  [facility] at [facility] [address] [place], [address] [place], [place] [address] Phone # [phone] Fax # [phone]

## 2022-11-12 NOTE — Patient Instructions (Signed)
I saw you today for the weakness, numbness, and tingling attack last month. Your symptoms are consistent with GBS and you appear to be making some improvements.  I would continue PT and OT.  I am also ordering speech therapy today. Someone will call you to schedule. Let me know if you do not hear from someone in 1-2 weeks.  I would cover your eyes or use lacrilube ointment at night to keep your eyes closed. I would also use eye drops frequently.  You can continue the gabapentin for tingling, or stop if this is not bothering you.  I want to see you back in clinic in 3 months or sooner if needed. Please let me know if you have any questions or concerns in the meantime.   The physicians and staff at [facility] are committed to providing excellent care. You may receive a survey requesting feedback about your experience at our office. We strive to receive "very good" responses to the survey questions. If you feel that your experience would prevent you from giving the office a "very good " response, please contact our office to try to remedy the situation. We may be reached at [phone]. Thank you for taking the time out of your busy day to complete the survey.  Jacquelyne Balint, MD [facility]

## 2022-11-15 ENCOUNTER — Ambulatory Visit: Payer: BC Managed Care – PPO | Admitting: Physical Therapy

## 2022-11-15 ENCOUNTER — Ambulatory Visit: Payer: BC Managed Care – PPO | Attending: Physician Assistant | Admitting: Occupational Therapy

## 2022-11-15 ENCOUNTER — Encounter: Payer: Self-pay | Admitting: Physical Therapy

## 2022-11-15 ENCOUNTER — Other Ambulatory Visit: Payer: Self-pay

## 2022-11-15 ENCOUNTER — Encounter: Payer: Self-pay | Admitting: Neurology

## 2022-11-15 DIAGNOSIS — R1311 Dysphagia, oral phase: Secondary | ICD-10-CM | POA: Insufficient documentation

## 2022-11-15 DIAGNOSIS — R471 Dysarthria and anarthria: Secondary | ICD-10-CM | POA: Diagnosis not present

## 2022-11-15 DIAGNOSIS — R208 Other disturbances of skin sensation: Secondary | ICD-10-CM | POA: Insufficient documentation

## 2022-11-15 DIAGNOSIS — G610 Guillain-Barre syndrome: Secondary | ICD-10-CM | POA: Diagnosis not present

## 2022-11-15 DIAGNOSIS — M6281 Muscle weakness (generalized): Secondary | ICD-10-CM | POA: Insufficient documentation

## 2022-11-15 NOTE — Therapy (Signed)
OUTPATIENT OCCUPATIONAL THERAPY NEURO EVALUATION  Patient Name: Alexander Mcgrath MRN: [id number] DOB:Jun 25, 1985, 37 y.o., male Today's Date: 11/15/2022  PCP: [facility] REFERRING PROVIDER: Charlton Amor, PA-C  END OF SESSION:  OT End of Session - 11/15/22 1615     Visit Number 1    Number of Visits 1    Authorization Type BCBS    OT Start Time 1532    OT Stop Time 1612    OT Time Calculation (min) 40 min             Past Medical History:  Diagnosis Date   Polio    TB of vertebral column    Past Surgical History:  Procedure Laterality Date   ORIF FEMUR FRACTURE     SPINAL FUSION     TIBIA IM NAIL INSERTION Left    Patient Active Problem List   Diagnosis Date Noted   Malnutrition of moderate degree 10/23/2022   Difficulty coping 10/22/2022   AIDP (acute inflammatory demyelinating polyneuropathy) (HCC) 10/19/2022   Numbness 10/14/2022   Weakness 10/12/2022    ONSET DATE: 10/08/22  REFERRING DIAG: G61.0 (ICD-10-CM) - AIDP (acute inflammatory demyelinating polyneuropathy)  THERAPY DIAG:  Muscle weakness (generalized)  Other disturbances of skin sensation  Rationale for Evaluation and Treatment: Rehabilitation  SUBJECTIVE:   SUBJECTIVE STATEMENT: Pt reports about a month ago that he was noticing weakness and inability to lift up his w/c, he was checked out at ED but that they did not seen anything and recommended that he return if he got worse.  Pt then reports ~4 days later he called 911 due to progressive weakness.  Neurology consulted felt this was AIDP and underwent IVIG x 5 treatments with some improvement left upper extremity sensation and strength. Pt reports that he returned to work 11/09/22 with ability to complete at prior level.  Pt reports that he is still not quite 100% strength wise but is nearing it.   Pt accompanied by: self  PERTINENT HISTORY: 37 y.o. 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 He was seen for these diagnosis at [facility] by pediatric PM&R as well as neurology.   PRECAUTIONS: Fall  WEIGHT BEARING RESTRICTIONS: No  PAIN:  Are you having pain? No  FALLS: Has patient fallen in last 6 months? Yes. 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, Leisure: plays w/c basketball, and Mod I with transfers and ADLs from w/c level and use of tub bench for showering  PATIENT GOALS: to get back to playing w/c basketball as before  OBJECTIVE:   HAND DOMINANCE: Right  ADLs: Overall ADLs: Mod I with ADLs from w/c level and use of tub bench for showering Transfers/ambulation related to ADLs: Mod I with w/c Equipment: Transfer tub bench  IADLs: Community mobility: Mod I, driving car with hand controls  MOBILITY STATUS: Hx of falls, Mod I w/c user  POSTURE COMMENTS:  No Significant postural limitations  ACTIVITY TOLERANCE: Activity tolerance: reports decreased endurance with w/c basketball but noticing improvements all the time  FUNCTIONAL OUTCOME MEASURES: Upper Extremity Functional Scale (UEFS):  76/80 = 95%  UPPER EXTREMITY ROM:  WFL bilaterally  UPPER EXTREMITY MMT:   RUE- biceps 4+/5; Triceps 4+/5; WE 4+/5; grip 4/5      LUE- biceps 4/5; Triceps 4/5; WE 4-/5; Grip 4-/5  HAND FUNCTION: Grip strength: Right: 68, 64 lbs; Left: 62, 60 lbs, Lateral pinch: Right: 14 lbs, Left: 14 lbs, 3 point pinch: Right: 14 lbs, Left: 15 lbs, and Tip pinch: Right 12 lbs, Left: 12 lbs  COORDINATION: 9 Hole Peg test: Right: 20.60  sec; Left: 23.63 sec  SENSATION: Light touch: diminished light touch in distal finger tips R> L  COGNITION: Overall cognitive status: Within functional limits for tasks assessed  VISION: Subjective report: difficulty closing B eyes all the way, using eye drops  Baseline vision: No visual deficits   TODAY'S TREATMENT:                                                                                                                              DATE:  11/15/22 Educated on continued engagement in grip and pinch strength with red and green theraputty and continued engagement in UE exercises with theraband from [facility].  Encouraged continued engagement in functional and leisure activities to continue to improve strength and endurance.   PATIENT EDUCATION: Education details: Educated on role and purpose of OT as well as potential interventions and goals for therapy based on initial evaluation findings. Person educated: Patient Education method: Explanation Education comprehension: verbalized understanding  HOME EXERCISE PROGRAM: NA   GOALS: Goals reviewed with patient? Yes  SHORT TERM GOALS: Target date: 11/15/22  Pt will verbalize understanding of engagement in functional and leisure tasks for increased strengthening and endurance. Baseline: Goal status: MET  ASSESSMENT:  CLINICAL IMPRESSION: Patient is a 37 y.o. male who was seen today for occupational therapy evaluation for weakness s/p hospital admission 10/12/22-10/19/22 for AIDP with subsequent CIR 8/13-8/20. Pt currently lives alone in a one story home with ramp to enter and works as a Psychologist, occupational prior to onset. Pt presents with mild BUE weakness and decreased grip strength, however pt reports that he has returned to living alone and has returned to work and feels that he is approaching 100%.  Pt will not require OT services at this time.  PERFORMANCE DEFICITS: in functional skills including strength and endurance.  IMPAIRMENTS: are  limiting patient from leisure.   CO-MORBIDITIES: may have co-morbidities  that affects occupational performance. Patient will benefit from skilled OT to address above impairments and improve overall function.  MODIFICATION OR ASSISTANCE TO COMPLETE EVALUATION: No modification of tasks or assist necessary to complete an evaluation.  OT OCCUPATIONAL PROFILE AND HISTORY: Problem focused assessment: Including review of records relating to presenting problem.  CLINICAL DECISION MAKING: LOW - limited treatment options, no task modification necessary  REHAB POTENTIAL: Good  EVALUATION COMPLEXITY: Low    PLAN:  OT FREQUENCY: one time visit  OT DURATION: other: one time visit  PLANNED INTERVENTIONS: self  care/ADL training and therapeutic exercise  RECOMMENDED OTHER SERVICES: NA  CONSULTED AND AGREED WITH PLAN OF CARE: Patient   Rosalio Loud, OTR/L 11/15/2022, 4:23 PM

## 2022-11-17 ENCOUNTER — Other Ambulatory Visit: Payer: Self-pay

## 2022-11-17 ENCOUNTER — Ambulatory Visit: Payer: BC Managed Care – PPO

## 2022-11-17 DIAGNOSIS — R1311 Dysphagia, oral phase: Secondary | ICD-10-CM

## 2022-11-17 DIAGNOSIS — G610 Guillain-Barre syndrome: Secondary | ICD-10-CM | POA: Diagnosis not present

## 2022-11-17 DIAGNOSIS — R208 Other disturbances of skin sensation: Secondary | ICD-10-CM | POA: Diagnosis not present

## 2022-11-17 DIAGNOSIS — R471 Dysarthria and anarthria: Secondary | ICD-10-CM

## 2022-11-17 DIAGNOSIS — M6281 Muscle weakness (generalized): Secondary | ICD-10-CM | POA: Diagnosis not present

## 2022-11-17 NOTE — Therapy (Signed)
OUTPATIENT SPEECH LANGUAGE PATHOLOGY EVALUATION   Patient Name: Alexander Mcgrath MRN: [id number] DOB:1985-10-20, 37 y.o., male Today's Date: 11/17/2022  PCP: None noted in Epic REFERRING PROVIDER: Jacquelyne Balint, MD  END OF SESSION:  End of Session - 11/17/22 1617     Visit Number 1    Number of Visits 7    Date for SLP Re-Evaluation 12/31/22    SLP Start Time 1620    SLP Stop Time  1715    SLP Time Calculation (min) 55 min    Activity Tolerance Patient tolerated treatment well             Past Medical History:  Diagnosis Date   Polio    TB of vertebral column    Past Surgical History:  Procedure Laterality Date   ORIF FEMUR FRACTURE     SPINAL FUSION     TIBIA IM NAIL INSERTION Left    Patient Active Problem List   Diagnosis Date Noted   Malnutrition of moderate degree 10/23/2022   Difficulty coping 10/22/2022   AIDP (acute inflammatory demyelinating polyneuropathy) (HCC) 10/19/2022   Numbness 10/14/2022   Weakness 10/12/2022    ONSET DATE: 10/12/22   REFERRING DIAG: Facial weakness, GBS,   THERAPY DIAG:  Dysarthria and anarthria  Dysphagia, oral phase  Rationale for Evaluation and Treatment: Rehabilitation  SUBJECTIVE:   SUBJECTIVE STATEMENT: "I just take small sips and suck. It doesn't come out anymore." Pt accompanied by: self  PERTINENT HISTORY: remote hx of polio (came to [place] around age 52) and spinal TB presented with 5 days of numbness in left leg that spread to his bilateral upper extremities. Symptoms progressed to bilateral upper extremity weakness, as well as speech difficulties and dysphagia.  PAIN:  Are you having pain? No  FALLS: Has patient fallen in last 6 months?  No  LIVING ENVIRONMENT: Lives with: lives alone Lives in: House/apartment  PLOF:  Level of assistance: Independent with ADLs, Independent with IADLs Employment: Full-time employment  PATIENT GOALS: improve speech - labial movement  OBJECTIVE:   DIAGNOSTIC  FINDINGS:  SLE 10/13/22: Pt demonstrates a moderate oral dysphagia, appearing primarily related to bilateral facial nerve weakness and sensory impairment impacting tongue. Pt has no buccal tension or labial seal. He is able to achieve adequate lingual ROM and has fairly good lingual tension, though he reports his tongue is numb and articulation is a little imprecise. Mandibular strength good, cough strength good, able to initaite a swallow. Facial nerve deficits primarily impact oral phase; pt cannot get a good seal on a straw, uses his tongue against palate to draw up fluid. Is not attempting a cup given potential for anterior spillage. Pt can feed himself with a spoon and can transit semi solid textures with minimal report of residue. He says sometimes it has been hard to move the food around in his mouth and he has used his finger. SLP encouraged a liquid wash. There were no signs of aspiration or pharyngeal weakness at this time, though there is potential for further decline. Discussed signs of pharyngeal impairment with pt, who is cognitively intact and able to self monitor. Will resume full liquid for now, encouraged protein shakes to pt. Will f/u daily.  COGNITION: Overall cognitive status: Within functional limits for tasks assessed   MOTOR SPEECH: Overall motor speech: impaired Level of impairment: Word Respiration: thoracic breathing and diaphragmatic/abdominal breathing Phonation: normal Resonance: WFL Articulation: Impaired: word Intelligibility: Intelligible  100% with min careful listening Motor planning: Appears intact Motor  speech errors: aware and consistent Interfering components:  neurological etiology - Guillan Barre Syndrome Effective technique: slow rate, over articulate, and pause Comments: phonemes with particular difficulty are /p/, /b/, /m/, /f/, /v/, "oo", "oh", and /w/. Intelligibility was minimally affected, if at all with this trained listener   ORAL MOTOR  EXAMINATION: Overall status: Impaired: Labial: Bilateral (ROM, Strength, and Coordination), bil impairment; rt more impaired than lt Lingual: WFL Facial: Bilateral (ROM and Coordination) Cough: WFL  CLINICAL SWALLOW ASSESSMENT:   Current diet: Dysphagia 3 (mechanical soft) and thin liquids with regular diet items Dentition: adequate natural dentition Patient directly observed with POs: Yes: regular and thin liquids  Feeding: able to feed self Liquids provided by: cup Oral phase signs and symptoms: prolonged bolus formation and (reported) intermittent lt labial sulcus residue,   rt labial leakage (reported, if too large a sip) Pharyngeal phase signs and symptoms:  none noted Comments: Jhordan is reducing sip size and sucking liquids in order to eliminate incidents of rt labial leakage  PATIENT REPORTED OUTCOME MEASURES (PROM): EAT-10: to be provided in first 1-2 sessions   TODAY'S TREATMENT:                                                                                                                                         DATE:   11/17/22 (eval): SLP developed a specialized HEP for pt to focus on labial and cheek movement. SLP demonstrated each exercise and pt attempted each exercise after SLP. Lip ROM exercises were very challenging for pt but he attempted. SLP told pt to attempt x3-4 and if unable to complete move on to next exercise. SLP further suggested pt complete 2-3 of the exercises in front of a mirror to notate improvement in labial movement.  PATIENT EDUCATION: Education details: see "today's treatment" Person educated: Patient Education method: Explanation, Demonstration, Verbal cues, and Handouts Education comprehension: verbalized understanding, returned demonstration, verbal cues required, and needs further education   GOALS: Goals reviewed with patient? Yes  SHORT TERM GOALS: Target date: 12/17/22  Pt will complete HEP with independence x2  sessions Baseline: Goal status: INITIAL  2.   Pt will maintain functional compensations for speech and swallowing x2 sessions  Baseline:  Goal status: INITIAL  3.  Pt will complete PROM Baseline:  Goal status: INITIAL   LONG TERM GOALS: Target date: 12/31/22  Pt will complete HEP with independence after 12/17/22 x2 sessions Baseline:  Goal status: INITIAL  2.  Pt will maintain functional compensations for speech and swallowing x2 sessions after 12/17/22 Baseline:  Goal status: INITIAL  3.  Pt will improve with PROM score compared to initial score Baseline:  Goal status: INITIAL    ASSESSMENT:  CLINICAL IMPRESSION: Patient is a 37 y.o. M who was seen today for assessment of swallowing and speech in light of recent incident of GBS. Pt indicated he would like to improve labial  movement for speaking, and eating. SLP told pt SLP could offer HEP for muscle recruitment, and compensations for speech and swallowing. SLP encouraged pt to contact referring MD to inquire of any ST departments at any teaching institutions in the surrounding local areas [place], [place], [place]) specializing in autoimmune nerve/muscle disorders treatment.  OBJECTIVE IMPAIRMENTS: include dysarthria and dysphagia. These impairments are limiting patient from return to work, household responsibilities, ADLs/IADLs, effectively communicating at home and in community, and safety when swallowing. Factors affecting potential to achieve goals and functional outcome are  questionable recovery timeline . Patient will benefit from skilled SLP services to address above impairments and improve overall function.  REHAB POTENTIAL: Good  PLAN:  SLP FREQUENCY: 1x/week  SLP DURATION: 6 weeks  PLANNED INTERVENTIONS: Diet toleration management , Environmental controls, Oral motor exercises, SLP instruction and feedback, Compensatory strategies, and Patient/family education    [facility], CCC-SLP 11/17/2022, 5:56  PM

## 2022-11-17 NOTE — Patient Instructions (Signed)
   LIP exercises  --- DO ALL EXERCISES TWICE EACH DAY  1. Lip press - Press your lips together firmly (like making a /m/ sound) and hold 5 seconds -repeat 10 times  2. LOUD and LONG Pryor Montes - make a kissing sound as loud as you can, as long as you can - repeat 10 times  3. Move a licorice stick or straw from side to side in your mouth - 10 back and forth movements  4. Say "OOOOO", and "EEEEE" 10 times (Kiss and smile)  5. Put air in your cheeks, and push on either side 10 times  *KEEP THE AIR IN and DON'T LET IT ESCAPE!!*   6. Press hard and briefly hold all the beginning sounds in these words: Repeat each set 2 times     "ma ma ma"    "boo boo boo"  "pa pa pa."          "moo moo moo" " bye bye bye"  "pie pie pie"          "me me me"  "bee bee bee"  "pea pea pea"   7. Practice whistling for __15__ seconds, 2-3 times.  8. "Blow out candles."  Repeat _10___ times.

## 2022-11-22 ENCOUNTER — Ambulatory Visit: Payer: BC Managed Care – PPO

## 2022-11-22 DIAGNOSIS — G610 Guillain-Barre syndrome: Secondary | ICD-10-CM | POA: Diagnosis not present

## 2022-11-22 DIAGNOSIS — R471 Dysarthria and anarthria: Secondary | ICD-10-CM | POA: Diagnosis not present

## 2022-11-22 DIAGNOSIS — R208 Other disturbances of skin sensation: Secondary | ICD-10-CM | POA: Diagnosis not present

## 2022-11-22 DIAGNOSIS — R1311 Dysphagia, oral phase: Secondary | ICD-10-CM

## 2022-11-22 DIAGNOSIS — M6281 Muscle weakness (generalized): Secondary | ICD-10-CM | POA: Diagnosis not present

## 2022-11-22 NOTE — Therapy (Signed)
OUTPATIENT SPEECH LANGUAGE PATHOLOGY TREATMENT   Patient Name: Alexander Mcgrath MRN: [id number] DOB:1985-11-02, 37 y.o., male Today's Date: 11/22/2022  PCP: None noted in Epic REFERRING PROVIDER: Jacquelyne Balint, MD  END OF SESSION:  End of Session - 11/22/22 1611     Visit Number 2    Number of Visits 7    Date for SLP Re-Evaluation 12/31/22    SLP Start Time 1613    SLP Stop Time  1650    SLP Time Calculation (min) 37 min    Activity Tolerance Patient tolerated treatment well             Past Medical History:  Diagnosis Date   Polio    TB of vertebral column    Past Surgical History:  Procedure Laterality Date   ORIF FEMUR FRACTURE     SPINAL FUSION     TIBIA IM NAIL INSERTION Left    Patient Active Problem List   Diagnosis Date Noted   Malnutrition of moderate degree 10/23/2022   Difficulty coping 10/22/2022   AIDP (acute inflammatory demyelinating polyneuropathy) (HCC) 10/19/2022   Numbness 10/14/2022   Weakness 10/12/2022    ONSET DATE: 10/12/22   REFERRING DIAG: Facial weakness, GBS  THERAPY DIAG:  Dysarthria and anarthria  Dysphagia, oral phase  Rationale for Evaluation and Treatment: Rehabilitation  SUBJECTIVE:   SUBJECTIVE STATEMENT: Pt arrives on time and willing to participate in ST session today. Pt accompanied by: self  PERTINENT HISTORY: remote hx of polio (came to [place] around age 17) and spinal TB presented with 5 days of numbness in left leg that spread to his bilateral upper extremities. Symptoms progressed to bilateral upper extremity weakness, as well as speech difficulties and dysphagia.  PAIN:  Are you having pain? No  FALLS: Has patient fallen in last 6 months?  No  PATIENT GOALS: improve speech - labial movement  OBJECTIVE:   DIAGNOSTIC FINDINGS:  SLE 10/13/22: Pt demonstrates a moderate oral dysphagia, appearing primarily related to bilateral facial nerve weakness and sensory impairment impacting tongue. Pt has no  buccal tension or labial seal. He is able to achieve adequate lingual ROM and has fairly good lingual tension, though he reports his tongue is numb and articulation is a little imprecise. Mandibular strength good, cough strength good, able to initaite a swallow. Facial nerve deficits primarily impact oral phase; pt cannot get a good seal on a straw, uses his tongue against palate to draw up fluid. Is not attempting a cup given potential for anterior spillage. Pt can feed himself with a spoon and can transit semi solid textures with minimal report of residue. He says sometimes it has been hard to move the food around in his mouth and he has used his finger. SLP encouraged a liquid wash. There were no signs of aspiration or pharyngeal weakness at this time, though there is potential for further decline. Discussed signs of pharyngeal impairment with pt, who is cognitively intact and able to self monitor. Will resume full liquid for now, encouraged protein shakes to pt. Will f/u daily.  CLINICAL SWALLOW ASSESSMENT:   Current diet: Dysphagia 3 (mechanical soft) and thin liquids with regular diet items Dentition: adequate natural dentition Patient directly observed with POs: Yes: regular and thin liquids  Feeding: able to feed self Liquids provided by: cup Oral phase signs and symptoms: prolonged bolus formation and (reported) intermittent lt labial sulcus residue,   rt labial leakage (reported, if too large a sip) Pharyngeal phase signs and symptoms:  none noted Comments: Torez is reducing sip size and sucking liquids in order to eliminate incidents of rt labial leakage  PATIENT REPORTED OUTCOME MEASURES (PROM): EAT-10: provided today, with instructions to return next session.   TODAY'S TREATMENT:                                                                                                                                         DATE:  11/22/22: Pt stated he will think about pursuing referral to a  specialist SLP if limited improvement by time of follow up with Dr. Loleta Chance.  SLP educated pt on speech compensations and pt integrated these into his conversational speech. He remains 100% intelligible to this listener and states he has very little incidence of people *not* understanding him in the community. Khing stated that when he rarely notices people have difficulty understanding him he "slows it down" and this improves the effectiveness of his communication. He tells SLP he is not hindered in participating in verbal dialogue due to his dysarthria.  Pt showed SLP some minimal change in labial movement in lips compared to previous week. SLP can see approx 1-2 mm of pt's front teeth on lt side, minimal (<1 mm) showing on rt side. Pt cont to report functional success with POs using compensations discussed last week with BSE. Pt completed HEP with independence today - no noticeable change in lip ROM exercises except possibly minimal retraction noted on lt side with "eeeee". SLP and pt agree pt could be seen every two weeks.   11/17/22 (eval): SLP developed a specialized HEP for pt to focus on labial and cheek movement. SLP demonstrated each exercise and pt attempted each exercise after SLP. Lip ROM exercises were very challenging for pt but he attempted. SLP told pt to attempt x3-4 and if unable to complete move on to next exercise. SLP further suggested pt complete 2-3 of the exercises in front of a mirror to notate improvement in labial movement.  PATIENT EDUCATION: Education details: see "today's treatment" Person educated: Patient Education method: Explanation, Demonstration, Verbal cues, and Handouts Education comprehension: verbalized understanding, returned demonstration, verbal cues required, and needs further education   GOALS: Goals reviewed with patient? Yes  SHORT TERM GOALS: Target date: 12/17/22  Pt will complete HEP with independence x2 sessions Baseline: 11/22/22 Goal status:  INITIAL  2.   Pt will maintain functional compensations for speech and swallowing x2 sessions  Baseline:  11/22/22 Goal status: INITIAL  3.  Pt will complete PROM Baseline:  Goal status: INITIAL   LONG TERM GOALS: Target date: 12/31/22  Pt will complete HEP with independence after 12/17/22 x2 sessions Baseline:  Goal status: INITIAL  2.  Pt will maintain functional compensations for speech and swallowing x2 sessions after 12/17/22 Baseline:  Goal status: INITIAL  3.  Pt will improve with PROM score compared to initial score Baseline:  Goal status: INITIAL  ASSESSMENT:  CLINICAL IMPRESSION: Patient is a 37 y.o. M who was seen today for assessment of swallowing and speech in light of recent incident of GBS. SLP told pt SLP could offer HEP for muscle recruitment, and compensations for speech and swallowing. Pt cont to use compensatory measures in swallowing and speech today using compensations was more functional than without, although both were 100% intelligible.   OBJECTIVE IMPAIRMENTS: include dysarthria and dysphagia. These impairments are limiting patient from return to work, household responsibilities, ADLs/IADLs, effectively communicating at home and in community, and safety when swallowing. Factors affecting potential to achieve goals and functional outcome are  questionable recovery timeline . Patient will benefit from skilled SLP services to address above impairments and improve overall function.  REHAB POTENTIAL: Good  PLAN:  SLP FREQUENCY: 1x/week  SLP DURATION: 6 weeks  PLANNED INTERVENTIONS: Diet toleration management , Environmental controls, Oral motor exercises, SLP instruction and feedback, Compensatory strategies, and Patient/family education    [facility], CCC-SLP 11/22/2022, 5:27 PM

## 2022-11-29 ENCOUNTER — Ambulatory Visit: Payer: BC Managed Care – PPO

## 2022-12-06 ENCOUNTER — Ambulatory Visit: Payer: BC Managed Care – PPO

## 2022-12-06 DIAGNOSIS — G610 Guillain-Barre syndrome: Secondary | ICD-10-CM | POA: Diagnosis not present

## 2022-12-06 DIAGNOSIS — E538 Deficiency of other specified B group vitamins: Secondary | ICD-10-CM | POA: Diagnosis not present

## 2022-12-06 DIAGNOSIS — L309 Dermatitis, unspecified: Secondary | ICD-10-CM | POA: Diagnosis not present

## 2022-12-08 ENCOUNTER — Ambulatory Visit: Payer: BC Managed Care – PPO | Attending: Physician Assistant

## 2022-12-08 ENCOUNTER — Telehealth: Payer: Self-pay | Admitting: Physical Medicine and Rehabilitation

## 2022-12-08 ENCOUNTER — Encounter: Payer: BC Managed Care – PPO | Admitting: Physical Medicine and Rehabilitation

## 2022-12-08 DIAGNOSIS — R1311 Dysphagia, oral phase: Secondary | ICD-10-CM | POA: Insufficient documentation

## 2022-12-08 DIAGNOSIS — R471 Dysarthria and anarthria: Secondary | ICD-10-CM | POA: Insufficient documentation

## 2022-12-08 NOTE — Therapy (Signed)
OUTPATIENT SPEECH LANGUAGE PATHOLOGY TREATMENT   Patient Name: Alexander Mcgrath MRN: [id number] DOB:[birth year], 37 y.o., male Today's Date: 12/08/2022  PCP: None noted in Epic REFERRING PROVIDER: Jacquelyne Balint, MD  END OF SESSION:  End of Session - 12/08/22 1540     Visit Number 3    Number of Visits 7    Date for SLP Re-Evaluation 12/31/22    SLP Start Time 1534    SLP Stop Time  1614    SLP Time Calculation (min) 40 min    Activity Tolerance Patient tolerated treatment well             Past Medical History:  Diagnosis Date   Polio    TB of vertebral column    Past Surgical History:  Procedure Laterality Date   ORIF FEMUR FRACTURE     SPINAL FUSION     TIBIA IM NAIL INSERTION Left    Patient Active Problem List   Diagnosis Date Noted   Malnutrition of moderate degree 10/23/2022   Difficulty coping 10/22/2022   AIDP (acute inflammatory demyelinating polyneuropathy) (HCC) 10/19/2022   Numbness 10/14/2022   Weakness 10/12/2022    ONSET DATE: 10/12/22   REFERRING DIAG: Facial weakness, GBS  THERAPY DIAG:  Dysarthria and anarthria  Dysphagia, oral phase  Rationale for Evaluation and Treatment: Rehabilitation  SUBJECTIVE:   SUBJECTIVE STATEMENT: "It's definitely better." Pt accompanied by: self  PERTINENT HISTORY: remote hx of polio (came to [place] around age 19) and spinal TB presented with 5 days of numbness in left leg that spread to his bilateral upper extremities. Symptoms progressed to bilateral upper extremity weakness, as well as speech difficulties and dysphagia.  PAIN:  Are you having pain? No  FALLS: Has patient fallen in last 6 months?  No  PATIENT GOALS: improve speech - labial movement  OBJECTIVE:   DIAGNOSTIC FINDINGS:  SLE 10/13/22: Pt demonstrates a moderate oral dysphagia, appearing primarily related to bilateral facial nerve weakness and sensory impairment impacting tongue. Pt has no buccal tension or labial seal. He is able to  achieve adequate lingual ROM and has fairly good lingual tension, though he reports his tongue is numb and articulation is a little imprecise. Mandibular strength good, cough strength good, able to initaite a swallow. Facial nerve deficits primarily impact oral phase; pt cannot get a good seal on a straw, uses his tongue against palate to draw up fluid. Is not attempting a cup given potential for anterior spillage. Pt can feed himself with a spoon and can transit semi solid textures with minimal report of residue. He says sometimes it has been hard to move the food around in his mouth and he has used his finger. SLP encouraged a liquid wash. There were no signs of aspiration or pharyngeal weakness at this time, though there is potential for further decline. Discussed signs of pharyngeal impairment with pt, who is cognitively intact and able to self monitor. Will resume full liquid for now, encouraged protein shakes to pt. Will f/u daily.  CLINICAL SWALLOW ASSESSMENT:   Current diet: Dysphagia 3 (mechanical soft) and thin liquids with regular diet items Dentition: adequate natural dentition Patient directly observed with POs: Yes: regular and thin liquids  Feeding: able to feed self Liquids provided by: cup Oral phase signs and symptoms: prolonged bolus formation and (reported) intermittent lt labial sulcus residue,   rt labial leakage (reported, if too large a sip) Pharyngeal phase signs and symptoms:  none noted Comments: Alexander Mcgrath is reducing sip size and  sucking liquids in order to eliminate incidents of rt labial leakage  PATIENT REPORTED OUTCOME MEASURES (PROM): EAT-10: pt scored himself 10/40, with lower scores equating to swallowing with less of an impact on pt's QOL.   TODAY'S TREATMENT:                                                                                                                                         DATE:  12/08/22: "A lot of improvement (with my upper lip)" pt stated  that his upper lip is moving more. Today pt shows his teeth and SLP notes seeing at least 4+ mm of pt's teeth today on lt and 3-39mm on rt.  Pt is self-adjusting his speech rate to slow down in response to facial nonverbal cues he receives if someone is not understanding him - he states this occurs approx once/month.  With swallowing pt is able to close his lips more now and thus suck easier than he could two-three weeks ago. Bolus cohesion is still somewhat difficult with left labial/buccal  weakness -pt cont to use manual removal of residual in right labial sulcus by pushing on right cheek. Less improvement with this area, than with labial closure and improved sucking motion. HEP: Completed with independence. Pt is definitely having more labial strength than previous session. /p/ productions are tighter than previous sessions and upper lip does not "flop" as with productions in earlier sessions. /f/ and /v/ are still distorted.  Pt agrees he could be seen in approx 4 weeks.   11/22/22: Pt stated he will think about pursuing referral to a specialist SLP if limited improvement by time of follow up with Dr. Loleta Mcgrath.  SLP educated pt on speech compensations and pt integrated these into his conversational speech. He remains 100% intelligible to this listener and states he has very little incidence of people *not* understanding him in the community. Alexander Mcgrath stated that when he rarely notices people have difficulty understanding him he "slows it down" and this improves the effectiveness of his communication. He tells SLP he is not hindered in participating in verbal dialogue due to his dysarthria.  Pt showed SLP some minimal change in labial movement in lips compared to previous week. SLP can see approx 1-2 mm of pt's front teeth on lt side, minimal (<1 mm) showing on rt side. Pt cont to report functional success with POs using compensations discussed last week with BSE. Pt completed HEP with independence today - no  noticeable change in lip ROM exercises except possibly minimal retraction noted on lt side with "eeeee". SLP and pt agree pt could be seen every two weeks.   11/17/22 (eval): SLP developed a specialized HEP for pt to focus on labial and cheek movement. SLP demonstrated each exercise and pt attempted each exercise after SLP. Lip ROM exercises were very challenging for pt but he attempted. SLP told pt to attempt x3-4  and if unable to complete move on to next exercise. SLP further suggested pt complete 2-3 of the exercises in front of a mirror to notate improvement in labial movement.  PATIENT EDUCATION: Education details: see "today's treatment" Person educated: Patient Education method: Explanation, Demonstration, Verbal cues, and Handouts Education comprehension: verbalized understanding, returned demonstration, verbal cues required, and needs further education   GOALS: Goals reviewed with patient? Yes  SHORT TERM GOALS: Target date: 12/17/22  Pt will complete HEP with independence x2 sessions Baseline: 11/22/22 Goal status: Met  2.   Pt will maintain functional compensations for speech and swallowing x2 sessions  Baseline:  11/22/22 Goal status: Met  3.  Pt will complete PROM Baseline:  Goal status: met   LONG TERM GOALS: Target date: 12/31/22  Pt will complete HEP with independence after 12/17/22 x2 sessions Baseline:  Goal status: INITIAL  2.  Pt will maintain functional compensations for speech and swallowing x2 sessions after 12/17/22 Baseline:  Goal status: INITIAL  3.  Pt will improve with PROM score compared to initial score Baseline:  Goal status: INITIAL    ASSESSMENT:  CLINICAL IMPRESSION: Met all STGs. Patient is a 37 y.o. M who was seen today for treatment of swallowing and speech in light of recent incident of GBS. Pt cont to use compensatory measures in swallowing and speech today using compensations was more functional than without, although both were  100% intelligibleSLP told pt SLP could offer HEP for muscle recruitment, and compensations for speech and swallowing. .   OBJECTIVE IMPAIRMENTS: include dysarthria and dysphagia. These impairments are limiting patient from return to work, household responsibilities, ADLs/IADLs, effectively communicating at home and in community, and safety when swallowing. Factors affecting potential to achieve goals and functional outcome are  questionable recovery timeline . Patient will benefit from skilled SLP services to address above impairments and improve overall function.  REHAB POTENTIAL: Good  PLAN:  SLP FREQUENCY: 1x/week  SLP DURATION: 6 weeks  PLANNED INTERVENTIONS: Diet toleration management , Environmental controls, Oral motor exercises, SLP instruction and feedback, Compensatory strategies, and Patient/family education    [facility], CCC-SLP 12/08/2022, 5:41 PM

## 2022-12-08 NOTE — Telephone Encounter (Signed)
Patient came into office for Hospital Follow up. Dr Berline Chough is out sick. He is asking if he needs this appointment. He was in the hospital 3 months ago and is doing well. If Dr. Berline Chough feels he needs to be seen, he will reschedule.

## 2022-12-13 ENCOUNTER — Ambulatory Visit: Payer: BC Managed Care – PPO

## 2022-12-20 ENCOUNTER — Ambulatory Visit: Payer: BC Managed Care – PPO

## 2022-12-21 ENCOUNTER — Telehealth: Payer: Self-pay

## 2022-12-21 NOTE — Telephone Encounter (Signed)
Patient called stating he was denied for a manual wheelchair. States the order was sent to [facility]. He wants know if you have connection with [employer]. [phone]

## 2022-12-24 NOTE — Telephone Encounter (Signed)
Have called [employer]- they said it was denied because of [employer] w/c- which is hard ot get covered- W/C company will reach out to pt to discuss further- I do NOT have connection at [facility] to make them pay for things that they normally don't pay for- thanks- ML

## 2022-12-27 ENCOUNTER — Ambulatory Visit: Payer: BC Managed Care – PPO

## 2022-12-31 ENCOUNTER — Ambulatory Visit: Payer: BC Managed Care – PPO | Admitting: Diagnostic Neuroimaging

## 2023-01-05 ENCOUNTER — Ambulatory Visit: Payer: BC Managed Care – PPO

## 2023-01-05 DIAGNOSIS — R471 Dysarthria and anarthria: Secondary | ICD-10-CM

## 2023-01-05 DIAGNOSIS — R1311 Dysphagia, oral phase: Secondary | ICD-10-CM | POA: Diagnosis not present

## 2023-01-05 NOTE — Therapy (Signed)
OUTPATIENT SPEECH LANGUAGE PATHOLOGY TREATMENT/RECERTIFICATION   Patient Name: Alexander Mcgrath MRN: [id number] DOB:[birth year], 37 y.o., male Today's Date: 01/05/2023  PCP: None noted in Epic REFERRING PROVIDER: Jacquelyne Balint, MD  END OF SESSION:  End of Session - 01/05/23 1608     Visit Number 4    Number of Visits 7    Date for SLP Re-Evaluation 03/04/23    SLP Start Time 1533    SLP Stop Time  1604    SLP Time Calculation (min) 31 min    Activity Tolerance Patient tolerated treatment well              Past Medical History:  Diagnosis Date   Polio    TB of vertebral column    Past Surgical History:  Procedure Laterality Date   ORIF FEMUR FRACTURE     SPINAL FUSION     TIBIA IM NAIL INSERTION Left    Patient Active Problem List   Diagnosis Date Noted   Malnutrition of moderate degree 10/23/2022   Difficulty coping 10/22/2022   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 SLP would like to verify cont'd progress over time.   ONSET DATE: 10/12/22   REFERRING DIAG: Facial weakness, GBS  THERAPY DIAG:  Dysarthria and anarthria  Dysphagia, oral phase  Rationale for Evaluation and Treatment: Rehabilitation  SUBJECTIVE:   SUBJECTIVE STATEMENT: "I still have a little bit of the corner (of my lips), but it's good" Pt accompanied by: self  PERTINENT HISTORY: remote hx of polio (came to [place] around age 65) and spinal TB presented with 5 days of numbness in left leg that spread to his bilateral upper extremities. Symptoms progressed to bilateral upper  extremity weakness, as well as speech difficulties and dysphagia.  PAIN:  Are you having pain? No  FALLS: Has patient fallen in last 6 months?  No  PATIENT GOALS: improve speech - labial movement  OBJECTIVE:   DIAGNOSTIC FINDINGS:  SLE 10/13/22: Pt demonstrates a moderate oral dysphagia, appearing primarily related to bilateral facial nerve weakness and sensory impairment impacting tongue. Pt has no buccal tension or labial seal. He is able to achieve adequate lingual ROM and has fairly good lingual tension, though he reports his tongue is numb and articulation is a little imprecise. Mandibular strength good, cough strength good, able to initaite a swallow. Facial nerve deficits primarily impact oral phase; pt cannot get a good seal on a straw, uses his tongue against palate to draw up fluid. Is not attempting a cup given potential for anterior spillage. Pt can feed himself with a spoon and can transit semi solid textures with minimal report of residue. He says sometimes it has been hard to move the food around in his mouth and he has used his finger. SLP encouraged a liquid wash. There were no signs of aspiration or pharyngeal weakness at this time, though there is potential for further decline. Discussed signs of pharyngeal impairment with pt, who is cognitively intact and able to self monitor. Will resume full liquid for now, encouraged protein shakes to pt. Will f/u daily.  PATIENT REPORTED OUTCOME MEASURES (PROM): EAT-10:  pt scored himself 10/40, with lower scores equating to swallowing with less of an impact on pt's QOL.   TODAY'S TREATMENT:                                                                                                                                         DATE:  01/05/23: Pt reports improvement with rt margin labial leakage with liquids, now only rare occurrence.  Pt completing HEP once-twice/week in past couple weeks, SLP suggested keeping x3/week through November and  then decr to twice a week if desired, as long as desired. Today /f/ and /v/ are approximating/at WNL articulation targets. Pt and SLP discussed energy conservation due to pt sharing that after approx 30 minutes of playing basketball and talking consistently resulted in muscle cramping - SLP educated pt about energy conservation and taking time off from talking before and after the game for rest. SLP assessed muscle strength and labial musculature definitely improved. Pt to add this labial retraction and protrusion exercise as he desires - he return demonstrated. He suggested return in 8 weeks - SLP agrees, then likely d/c.   12/08/22: "A lot of improvement (with my upper lip)" pt stated that his upper lip is moving more. Today pt shows his teeth and SLP notes seeing at least 4+ mm of pt's teeth today on lt and 3-78mm on rt.  Pt is self-adjusting his speech rate to slow down in response to facial nonverbal cues he receives if someone is not understanding him - he states this occurs approx once/month.  With swallowing pt is able to close his lips more now and thus suck easier than he could two-three weeks ago. Bolus cohesion is still somewhat difficult with left labial/buccal  weakness -pt cont to use manual removal of residual in right labial sulcus by pushing on right cheek. Less improvement with this area, than with labial closure and improved sucking motion. HEP: Completed with independence. Pt is definitely having more labial strength than previous session. /p/ productions are tighter than previous sessions and upper lip does not "flop" as with productions in earlier sessions. /f/ and /v/ are still distorted.  Pt agrees he could be seen in approx 4 weeks.   11/22/22: Pt stated he will think about pursuing referral to a specialist SLP if limited improvement by time of follow up with Dr. Loleta Chance.  SLP educated pt on speech compensations and pt integrated these into his conversational speech. He remains 100%  intelligible to this listener and states he has very little incidence of people *not* understanding him in the community. [name] stated that when he rarely notices people have difficulty understanding him he "slows it down" and this improves the effectiveness of his communication. He tells SLP he is not hindered in participating in verbal dialogue due to his dysarthria.  Pt showed SLP some minimal change in labial movement in lips compared to  previous week. SLP can see approx 1-2 mm of pt's front teeth on lt side, minimal (<1 mm) showing on rt side. Pt cont to report functional success with POs using compensations discussed last week with BSE. Pt completed HEP with independence today - no noticeable change in lip ROM exercises except possibly minimal retraction noted on lt side with "eeeee". SLP and pt agree pt could be seen every two weeks.   11/17/22 (eval): SLP developed a specialized HEP for pt to focus on labial and cheek movement. SLP demonstrated each exercise and pt attempted each exercise after SLP. Lip ROM exercises were very challenging for pt but he attempted. SLP told pt to attempt x3-4 and if unable to complete move on to next exercise. SLP further suggested pt complete 2-3 of the exercises in front of a mirror to notate improvement in labial movement.  PATIENT EDUCATION: Education details: see "today's treatment" Person educated: Patient Education method: Explanation, Demonstration, Verbal cues, and Handouts Education comprehension: verbalized understanding, returned demonstration, verbal cues required, and needs further education   GOALS: Goals reviewed with patient? Yes  SHORT TERM GOALS: Target date: 12/17/22  Pt will complete HEP with independence x2 sessions Baseline: 11/22/22 Goal status: Met  2.   Pt will maintain functional compensations for speech and swallowing x2 sessions  Baseline:  11/22/22 Goal status: Met  3.  Pt will complete PROM Baseline:  Goal status:  met   LONG TERM GOALS: Target date: 12/31/22  Pt will complete HEP with independence after 12/17/22 x2 sessions Baseline: 01/05/23 Goal status: INITIAL  2.  Pt will maintain functional compensations for speech and swallowing x2 sessions after 12/17/22 Baseline:  Goal status: Met  3.  Pt will improve with PROM score compared to initial score Baseline:  Goal status: INITIAL    ASSESSMENT:  CLINICAL IMPRESSION: Patient is a 37 y.o. M who cont to be seen for treatment of swallowing and speech in light of recent incident of GBS. Pt cont to use compensatory measures in swallowing and speech today using compensations was more functional than without, although both were 100% intelligible. Pt with likely d/c after next session in 8 weeks.   OBJECTIVE IMPAIRMENTS: include dysarthria and dysphagia. These impairments are limiting patient from return to work, household responsibilities, ADLs/IADLs, effectively communicating at home and in community, and safety when swallowing. Factors affecting potential to achieve goals and functional outcome are  questionable recovery timeline . Patient will benefit from skilled SLP services to address above impairments and improve overall function.  REHAB POTENTIAL: Good  PLAN:  SLP FREQUENCY: 1x/week  SLP DURATION: 6 weeks  PLANNED INTERVENTIONS: Diet toleration management , Environmental controls, Oral motor exercises, SLP instruction and feedback, Compensatory strategies, and Patient/family education    [facility], CCC-SLP 01/05/2023, 9:21 PM

## 2023-01-25 DIAGNOSIS — Z13220 Encounter for screening for lipoid disorders: Secondary | ICD-10-CM | POA: Diagnosis not present

## 2023-01-25 DIAGNOSIS — B91 Sequelae of poliomyelitis: Secondary | ICD-10-CM | POA: Diagnosis not present

## 2023-01-25 DIAGNOSIS — Z131 Encounter for screening for diabetes mellitus: Secondary | ICD-10-CM | POA: Diagnosis not present

## 2023-01-25 DIAGNOSIS — E538 Deficiency of other specified B group vitamins: Secondary | ICD-10-CM | POA: Diagnosis not present

## 2023-01-25 DIAGNOSIS — Z0000 Encounter for general adult medical examination without abnormal findings: Secondary | ICD-10-CM | POA: Diagnosis not present

## 2023-01-27 DIAGNOSIS — L280 Lichen simplex chronicus: Secondary | ICD-10-CM | POA: Diagnosis not present

## 2023-02-01 NOTE — Progress Notes (Signed)
I saw Jaaron Gunnells in neurology clinic on 02/11/23 in follow up for GBS.  HPI: Quint Maga is a 37 y.o. 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  To briefly review: Patient's baseline before recent illness was wheelchair bound, but independent of transfers, driving, working as a Psychologist, occupational.   Patient first presented to the ED on 10/08/22 for numbness in the left leg and numbness and tingling in bilateral hands. There was also some increase in weakness though patient is wheelchair bound, so extent at that time was unclear. His strength in bilateral UE was 5/5 and cranial nerves were intact per documentation that day. CT head was unremarkable. Given that he worked as a Psychologist, occupational, the thought was it was working with his hands that was causing symptoms. Patient was discharged home.   He represented to the ED on 10/12/22 for ascending weakness. He noticed he was no longer able to transfer himself into his wheelchair. Per documentation, he was having difficulty communicating and left eye was "fuzzy" (blurry vision). He was unable to use facial muscles to smile. He had a hard time talking and swallowing. His upper extremities were now weak as well. There was concern for AIDP, nutritional deficiency, or meningitis, so patient was admitted. MRI brain and cervical spine were unremarkable. The plan was for LP, but even by IR, no CSF was able to be obtained. Given the high suspicion of AIDP, decision was made to start IVIg on 10/13/22 for 5 sessions. Patient had improvement of strength in upper extremities prior to discharge. Patient started feeling better after the 4th treatment. Patient also had elevated Coxsackie titers, which are of unclear significance to me and were not mentioned in hospital notes that I can find (maybe all IgG and old infection?).    Patient was discharged to rehab where he got PT, OT, speech therapy. He  continued to show improvement per documentation. His exam was as follows at dc from rehab: Right upper extremity biceps 4+/5 triceps 4+/5 wrist extension 4+/5 grip 4/5 FA 2+/5 Left upper extremity biceps 4/5 triceps 3+/5W EE 4 -/5 grip 3+/5 FA 2/5 Lower extremities trace movement in hip flexors/knee extension on left lower extremity but otherwise 0/5 Neurologic.  Alert oriented x 3 speech dysarthric but intelligible   Per patient, he continues to improve. He thinks his arms are much stronger. He still has a little bit of numbness and tingling in his fingers. Patient thinks his face the major area that has not improved as much. It may be improving, but not as much as his hands. He feels the right side of his face is very weak. He has no tingling and maybe not numbness. He thinks the forehead is fine, just the right lower face. He is swallowing okay. He can eat normal food, but has to be slow about it.    He denies any regression of symptoms. He has returned to work. He reports no trouble. He is driving again (hand controls). He is also returning to wheelchair basketball. He feels some weakness still while doing this.   Patient has not had similar symptoms in the past.   He is scheduled to establish with physical and occupational therapy next week. He does not think he has speech therapy.   Of note, patient has bowel and bladder normal function at baseline. This was never affected during his illness.   EtOH use: None  Restrictive  diet? Does not eat a lot of animal protein  Most recent Assessment and Plan (11/12/22): Jaquis Dupree is a 37 y.o. male who presents for evaluation of acute onset numbness, weakness, and difficulty swallowing. 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 His neurological examination is pertinent for bilateral significant facial weakness, atrophy and weakness of intrinsic hand muscles, and chronic paralysis of lower extremities.  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 He received 5 days of IVIg and had inpatient rehab with PT, OT, and speech therapy. His arms have improved greatly with some mild residual weakness and tingling in his hands. He does have intrinsic hand muscle atrophy that is likely more chronic as this level of atrophy would not be expected in 1 month time. 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 His most significant deficits is his facial weakness though. While he describes it being right sided, both sides seem equally weak on exam, severely so. Patient is to start PT and OT next week. I think speech therapy would also be helpful to hopefully get some recovery of facial muscles as well.    PLAN: -PT and OT as planned -Speech therapy referral -Eye care discussed - eye drops, ointment, patching at night -Can continue gabapentin if needed for tingling -Patient to call with new or worsening symptoms  Since their last visit: Patient continues to feel like lips are not moving as well as prior to GBS. He has significantly improved though. He has muscle twitching in right face. He still has issues with chewing and swallowing, particularly on the right side of mouth, but again, but improved over 6 months. He finds his lips are very dry often.  He feels arms and legs are about 97% normal. He denies numbness or tingling in extremities, but does have some shakiness of both hands.  He finished PT/OT. He continues to do speech therapy, now every 8 weeks.   MEDICATIONS:  Outpatient Encounter Medications as of 02/11/2023  Medication Sig   Multiple Vitamin (MULTIVITAMIN WITH MINERALS) TABS tablet Take 1 tablet by mouth daily.   acetaminophen (TYLENOL) 325 MG tablet Take 2 tablets (650 mg total) by mouth every 6 (six) hours as needed for mild pain,  moderate pain or headache. (Patient not taking: Reported on 12/08/2022)   [EXPIRED] cyanocobalamin 1000 MCG tablet Take 1 tablet (1,000 mcg total) by mouth daily.   gabapentin (NEURONTIN) 300 MG capsule Take 1 capsule (300 mg total) by mouth 2 (two) times daily. (Patient not taking: Reported on 02/11/2023)   ibuprofen (ADVIL) 200 MG tablet Take 400 mg by mouth every 6 (six) hours as needed for headache or moderate pain. (Patient not taking: Reported on 12/08/2022)   No facility-administered encounter medications on file as of 02/11/2023.    PAST MEDICAL HISTORY: Past Medical History:  Diagnosis Date   Polio    TB of vertebral column     PAST SURGICAL HISTORY: Past Surgical History:  Procedure Laterality Date   ORIF FEMUR FRACTURE     SPINAL FUSION     TIBIA IM NAIL INSERTION Left     ALLERGIES: No Known Allergies  FAMILY HISTORY: History reviewed. No pertinent family history.  SOCIAL HISTORY: Social History   Tobacco Use   Smoking status: Never   Smokeless tobacco: Never  Vaping Use  Vaping status: Never Used  Substance Use Topics   Alcohol use: Yes    Comment: occas   Social History   Social History Narrative   Are you right handed or left handed? Right   Are you currently employed ? 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    What type of home do you live in: 1 story or 2 story? one    Caffeine 2 cups a day     Objective:  Vital Signs:  BP 113/68   Pulse 98   SpO2 100%   General: General appearance: Awake and alert. No distress. Cooperative with exam.  Skin: No obvious rash or jaundice. HEENT: Atraumatic. Anicteric. Lungs: Non-labored breathing on room air   Neurological: Mental Status: Alert. Speech fluent. No pseudobulbar affect Cranial Nerves: CNII: No RAPD. Visual fields intact. CNIII, IV, VI: PERRL. No nystagmus. EOMI. CN V: Facial sensation intact bilaterally to pinprick. CN VII:  Facial muscles weak, including frontalis, orbicularis oculi, and orbicularis oris, but improved from prior. No ptosis at rest. CN VIII: Hears finger rub well bilaterally. CN IX: No hypophonia. CN X: Palate elevates symmetrically. CN XI: Full strength shoulder shrug bilaterally. CN XII: Tongue protrusion full and midline. No atrophy or fasciculations. No significant dysarthria Motor: Tone is normal in upper extremities. Decreased in bilateral lower extremities. Atrophy in bilateral FDI, ADM, and APB (R > L).  Individual muscle group testing (MRC grade out of 5):  Movement     Neck flexion 5    Neck extension 5     Right Left   Shoulder abduction 5 5   Elbow flexion 5 5   Elbow extension 5 5   Wrist extension 5 5   Wrist flexion 5 5   Finger abduction - FDI 4 4+   Finger abduction - ADM 4 4+   Finger extension 5 5   Finger distal flexion - 2/3 5 5    Finger distal flexion - 4/5 5 5    Thumb flexion - FPL 5 5   Thumb abduction - APB 4 4    No movement in right leg; able to twist some in left leg, but minimal   Reflexes: Trace throughout Sensation: Pinprick: intact in all extremities Coordination: Intact finger-to- nose-finger bilaterally. Gait: Wheelchair bound at baseline, unable to ambulate.   Lab and Test Review: No new results  Previously reviewed results: 10/12/22: B12: 436 B1 wnl B6 wnl RPR and HIV non-reactive SPEP: no M protein ESR and CRP wnl Heavy metals (arsenic, mercury, lead): wnl CMV wnl Porphyrins wnl Mg, phos wnl TSH wnl Folate wnl   Component     Latest Ref Rng 10/12/2022  Coxsackie A7 IgG     Neg:<1:100 titer 1:800 (H)   Coxsackie A9 IgG     Neg:<1:100 titer 1:800 (H)   Coxsackie A16 IgG     Neg:<1:100 titer 1:800 (H)   Coxsackie A24 IgG     Neg:<1:100 titer 1:800 (H)   Coxsackie A7 IgM     Neg:<1:10 titer Negative   Coxsackie A9 IgM     Neg:<1:10 titer Negative   Coxsackie A16 IgM     Neg:<1:10 titer Negative   Coxsackie A24 IgM      Neg:<1:10 titer Negative     Component     Latest Ref Rng 10/12/2022  COXSACKIE B1 AB     Neg:<1:100  1:100 (H)   COXSACKIE  B2 AB     Neg:<1:100  1:100 (H)   COXSACKIE B3 AB     Neg:<1:100  1:100 (H)   COXSACKIE B4 AB     Neg:<1:100  1:500 (H)   COXSACKIE B5 AB     Neg:<1:100  1:1000 (H)   COXSACKIE B6 AB     Neg:<1:100  1:500 (H)     10/20/22: CMP unremarkable CBC w/ diff unremarkable   Imaging: CT head wo contrast (10/08/22): FINDINGS: Brain: There is no evidence for acute hemorrhage, hydrocephalus, mass lesion, or abnormal extra-axial fluid collection. No definite CT evidence for acute infarction.   Vascular: No hyperdense vessel or unexpected calcification.   Skull: No evidence for fracture. No worrisome lytic or sclerotic lesion.   Sinuses/Orbits: The visualized paranasal sinuses and mastoid air cells are clear. Visualized portions of the globes and intraorbital fat are unremarkable.   Other: None.   IMPRESSION: Unremarkable head CT. No acute intracranial abnormality.   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 No fracture or spondylolisthesis is noted. Severe dextroscoliosis of thoracic spine is noted. Fusion of multiple disc spaces in the lower thoracic and upper lumbar spine is noted.   IMPRESSION: Postsurgical changes as noted above.  No acute abnormality seen.   MRI brain w/wo contrast (10/12/22): FINDINGS: Brain: No acute infarct, mass effect or extra-axial collection. No acute or chronic hemorrhage. Normal white matter signal, parenchymal volume and CSF spaces. The midline structures are normal.   Vascular: Major flow voids are preserved.   Skull and upper cervical spine: Normal calvarium and skull base. Visualized upper cervical spine and soft tissues are normal.   Sinuses/Orbits:No paranasal sinus fluid levels or advanced mucosal thickening. No mastoid or middle ear  effusion. Normal orbits.   IMPRESSION: Normal brain MRI.   MRI cervical spine w/wo contrast (10/12/22): FINDINGS: Alignment: There is mild kyphotic angulation centered at C4-5. No sagittal spondylolisthesis. The atlantodens interval is intact.   Vertebrae: Vertebral body heights are maintained. Minimal anterior C5-6 and C6-7 disc space narrowing. w within the limitations of patient motion artifact, no significant bone signal abnormality is seen.   Cord: The cervical cord demonstrates normal signal and caliber. No abnormal cord enhancement.   Posterior Fossa, vertebral arteries, paraspinal tissues: Negative.   Disc levels:   C2-3: Mild bilateral uncovertebral hypertrophy. No posterior disc bulge. No central canal or neuroforaminal stenosis.   C3-4: Mild bilateral facet joint hypertrophy. Mild left uncovertebral hypertrophy. Borderline mild left neuroforaminal stenosis. The right neural foramen is patent. No central canal stenosis.   C4-5: Mild bilateral facet joint hypertrophy. No posterior disc bulge, central canal narrowing, or neuroforaminal stenosis.   C5-6: Mild bilateral facet joint hypertrophy. Mild posterior disc bulge with mild bilateral intraforaminal extension. No significant neuroforaminal stenosis. No central canal stenosis.   C6-7: Mild posterior disc bulge. CSF is still seen ventral to the cord. No central canal or neuroforaminal stenosis.   C7-T1: No posterior disc bulge, central canal narrowing, or neuroforaminal stenosis.   T1-2: Unremarkable.   IMPRESSION: 1. Mild multilevel degenerative disc and joint changes as above. 2. Borderline mild left C3-4 neuroforaminal stenosis. 3. No central canal stenosis.   Lumbar puncture (10/13/22): With the patient prone, the lower back was prepped with Betadine. 1% Lidocaine was used for local anesthesia. Lumbar puncture was attempted at the L3/4 level using a 20 gauge needle, unsuccessful.   Second lumbar  puncture was attempted at the L4-L5 level, again  unsuccessful. Dr. Reche Dixon was called to the procedure room for assistance. Dr. Reche Dixon attempted lumbar puncture at the L4-L5 level, unsuccessful. Despite optimal needle positioning (example series 4) CSF did not return.   Another lumbar puncture attempt was made at the L5-S1 level, patient developed shooting sensation in his left leg, however CSF could not be obtained. Unsuccessful fluoro guided lumbar puncture.   The patient tolerated the procedure well and there were no apparent complications.   IMPRESSION: Unsuccessful fluoro guided lumbar puncture, as detailed above. Exam complicated by extensive thoracolumbar spine hardware.  ASSESSMENT: This is Dilraj Herin, a 37 y.o. male with acute onset numbness, weakness, and difficulty swallowing in 10/2022. 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 His neurological examination is pertinent for bilateral significant facial weakness, atrophy and weakness of intrinsic hand muscles, and chronic paralysis of lower extremities. 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 He received 5 days of IVIg and had inpatient rehab with PT, OT, and speech therapy. He continues to improve, but has some residual facial weakness, right more than left.   He also has intrinsic hand muscle atrophy that is likely more chronic as this level of atrophy would not be expected as a result of an acute process. 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   Plan: -Continue home PT/OT exercises -Continue speech therapy and home exercises -Patient to contact me if there is any worsening -Discussed not trying to lean on elbows  Return to clinic in 6 months  Total time spent reviewing records, interview, history/exam,  documentation, and coordination of care on day of encounter:  40 min  Jacquelyne Balint, MD

## 2023-02-11 ENCOUNTER — Ambulatory Visit (INDEPENDENT_AMBULATORY_CARE_PROVIDER_SITE_OTHER): Payer: BC Managed Care – PPO | Admitting: Neurology

## 2023-02-11 ENCOUNTER — Encounter: Payer: Self-pay | Admitting: Neurology

## 2023-02-11 VITALS — BP 113/68 | HR 98

## 2023-02-11 DIAGNOSIS — R531 Weakness: Secondary | ICD-10-CM

## 2023-02-11 DIAGNOSIS — R1310 Dysphagia, unspecified: Secondary | ICD-10-CM

## 2023-02-11 DIAGNOSIS — R200 Anesthesia of skin: Secondary | ICD-10-CM | POA: Diagnosis not present

## 2023-02-11 DIAGNOSIS — R29810 Facial weakness: Secondary | ICD-10-CM | POA: Diagnosis not present

## 2023-02-11 DIAGNOSIS — R471 Dysarthria and anarthria: Secondary | ICD-10-CM

## 2023-02-11 DIAGNOSIS — G5603 Carpal tunnel syndrome, bilateral upper limbs: Secondary | ICD-10-CM

## 2023-02-11 DIAGNOSIS — G610 Guillain-Barre syndrome: Secondary | ICD-10-CM | POA: Diagnosis not present

## 2023-02-11 DIAGNOSIS — G5623 Lesion of ulnar nerve, bilateral upper limbs: Secondary | ICD-10-CM

## 2023-02-11 NOTE — Patient Instructions (Signed)
Continue staying active and doing home exercises given by PT, OT, and speech therapy.  Please contact me if anything gets worse or you have new symptoms.  Go to nearest emergency room if you have severe weakness, difficulty breathing or swallowing as this could be a relapse, though this is rare.  Try to avoid leaning on your elbows as this may be pressing on a nerve causing some of your hand weakness.  The physicians and staff at [facility] are committed to providing excellent care. You may receive a survey requesting feedback about your experience at our office. We strive to receive "very good" responses to the survey questions. If you feel that your experience would prevent you from giving the office a "very good " response, please contact our office to try to remedy the situation. We may be reached at [phone]. Thank you for taking the time out of your busy day to complete the survey.  Jacquelyne Balint, MD [facility]

## 2023-03-08 DIAGNOSIS — G610 Guillain-Barre syndrome: Secondary | ICD-10-CM | POA: Diagnosis not present

## 2023-03-15 ENCOUNTER — Ambulatory Visit: Payer: BC Managed Care – PPO | Attending: Physician Assistant

## 2023-03-15 DIAGNOSIS — R1311 Dysphagia, oral phase: Secondary | ICD-10-CM

## 2023-03-15 DIAGNOSIS — R471 Dysarthria and anarthria: Secondary | ICD-10-CM | POA: Diagnosis not present

## 2023-03-15 NOTE — Therapy (Signed)
 OUTPATIENT SPEECH LANGUAGE PATHOLOGY TREATMENT/Renewal-Discharge summary   Patient Name: Alexander Mcgrath MRN: [id number] DOB:11/13/1985, 38 y.o., male Today's Date: 03/15/2023  PCP: None noted in Epic REFERRING PROVIDER: Leigh Ditch, MD  END OF SESSION:  End of Session - 03/15/23 1711     Visit Number 5    Number of Visits 7    Date for SLP Re-Evaluation 03/15/23    SLP Start Time 1620    SLP Stop Time  1641    SLP Time Calculation (min) 21 min    Activity Tolerance Patient tolerated treatment well               Past Medical History:  Diagnosis Date   Polio    TB of vertebral column    Past Surgical History:  Procedure Laterality Date   ORIF FEMUR FRACTURE     SPINAL FUSION     TIBIA IM NAIL INSERTION Left    Patient Active Problem List   Diagnosis Date Noted   Malnutrition of moderate degree 10/23/2022   Difficulty coping 10/22/2022   AIDP (acute inflammatory demyelinating polyneuropathy) (HCC) 10/19/2022   Numbness 10/14/2022   Weakness 10/12/2022   SPEECH THERAPY RENEWAL-DISCHARGE SUMMARY  Visits from Start of Care: 5  Current functional level related to goals / functional outcomes: Pt's speech and swallowing have improved since initial evaluation. Not at baseline yet but pt's abilities have improved each appointment. Alexander Mcgrath states and SLP agrees that progress is not as noticeable at this time as it was earlier.  Goals below under GOALS will be enforced for ST session today and then pt will be d/c'd.   Remaining deficits: Mild dysarthria, mild oral phase dysphagia.   Education / Equipment: See below for details.    Patient agrees to discharge. Patient goals were met. Patient is being discharged due to meeting the stated rehab goals.Goal for PROM was deferred due to pt leaving prior to SLP obtaining PROM measure. From pt's subjective report to SLP he has improved QOL following ST.     ONSET DATE: 10/12/22   REFERRING DIAG: Facial weakness,  GBS  THERAPY DIAG:  Dysarthria and anarthria  Dysphagia, oral phase  Rationale for Evaluation and Treatment: Rehabilitation  SUBJECTIVE:   SUBJECTIVE STATEMENT: It's definitely improved. Pt accompanied by: self  PERTINENT HISTORY: remote hx of polio (came to [place] around age 10) and spinal TB presented with 5 days of numbness in left leg that spread to his bilateral upper extremities. Symptoms progressed to bilateral upper extremity weakness, as well as speech difficulties and dysphagia.  PAIN:  Are you having pain? No  FALLS: Has patient fallen in last 6 months?  No  PATIENT GOALS: improve speech - labial movement  OBJECTIVE:   DIAGNOSTIC FINDINGS:  SLE 10/13/22: Pt demonstrates a moderate oral dysphagia, appearing primarily related to bilateral facial nerve weakness and sensory impairment impacting tongue. Pt has no buccal tension or labial seal. He is able to achieve adequate lingual ROM and has fairly good lingual tension, though he reports his tongue is numb and articulation is a little imprecise. Mandibular strength good, cough strength good, able to initaite a swallow. Facial nerve deficits primarily impact oral phase; pt cannot get a good seal on a straw, uses his tongue against palate to draw up fluid. Is not attempting a cup given potential for anterior spillage. Pt can feed himself with a spoon and can transit semi solid textures with minimal report of residue. He says sometimes it has been hard to move  the food around in his mouth and he has used his finger. SLP encouraged a liquid wash. There were no signs of aspiration or pharyngeal weakness at this time, though there is potential for further decline. Discussed signs of pharyngeal impairment with pt, who is cognitively intact and able to self monitor. Will resume full liquid for now, encouraged protein shakes to pt. Will f/u daily.  PATIENT REPORTED OUTCOME MEASURES (PROM): EAT-10: pt scored himself 10/40, with lower  scores equating to swallowing with less of an impact on pt's QOL.   TODAY'S TREATMENT:                                                                                                                                         DATE:  03/15/23: With s statement, Vere tells SLP he has noticed better lip seal with drinking, and better speech clarity. Neurologist stated recovery may persist for 1-3 years depending on how significant the nerve damage was. Pt has been completing HEP x3/week since last session. SLP told pt he could cont with this as long as he desired. SLP assessed pt's labial structure and function and appears as if he has even more lateral labial movement than in previous sessions. He is still unable to achieve labial seal adequate to alternate air between cheeks. SLP affirmed pt's current performance of moving liquid to alternating cheeks after brushing teeth, if pt desires. SLP reminded pt about energy conservation as he stated that recently he was talking later in the evening and noted some anterior labial leakage on rt. Pt agreed that d/c is appropriate today.   01/05/23: Pt reports improvement with rt margin labial leakage with liquids, now only rare occurrence.  Pt completing HEP once-twice/week in past couple weeks, SLP suggested keeping x3/week through November and then decr to twice a week if desired, as long as desired. Today /f/ and /v/ are approximating/at WNL articulation targets. Pt and SLP discussed energy conservation due to pt sharing that after approx 30 minutes of playing basketball and talking consistently resulted in muscle cramping - SLP educated pt about energy conservation and taking time off from talking before and after the game for rest. SLP assessed muscle strength and labial musculature definitely improved. Pt to add this labial retraction and protrusion exercise as he desires - he return demonstrated. He suggested return in 8 weeks - SLP agrees, then likely d/c.    12/08/22: A lot of improvement (with my upper lip) pt stated that his upper lip is moving more. Today pt shows his teeth and SLP notes seeing at least 4+ mm of pt's teeth today on lt and 3-16mm on rt.  Pt is self-adjusting his speech rate to slow down in response to facial nonverbal cues he receives if someone is not understanding him - he states this occurs approx once/month.  With swallowing pt is able to close his lips more now  and thus suck easier than he could two-three weeks ago. Bolus cohesion is still somewhat difficult with left labial/buccal  weakness -pt cont to use manual removal of residual in right labial sulcus by pushing on right cheek. Less improvement with this area, than with labial closure and improved sucking motion. HEP: Completed with independence. Pt is definitely having more labial strength than previous session. /p/ productions are tighter than previous sessions and upper lip does not flop as with productions in earlier sessions. /f/ and /v/ are still distorted.  Pt agrees he could be seen in approx 4 weeks.   11/22/22: Pt stated he will think about pursuing referral to a specialist SLP if limited improvement by time of follow up with Dr. Leigh.  SLP educated pt on speech compensations and pt integrated these into his conversational speech. He remains 100% intelligible to this listener and states he has very little incidence of people *not* understanding him in the community. Alexander Mcgrath stated that when he rarely notices people have difficulty understanding him he slows it down and this improves the effectiveness of his communication. He tells SLP he is not hindered in participating in verbal dialogue due to his dysarthria.  Pt showed SLP some minimal change in labial movement in lips compared to previous week. SLP can see approx 1-2 mm of pt's front teeth on lt side, minimal (<1 mm) showing on rt side. Pt cont to report functional success with POs using compensations discussed  last week with BSE. Pt completed HEP with independence today - no noticeable change in lip ROM exercises except possibly minimal retraction noted on lt side with eeeee. SLP and pt agree pt could be seen every two weeks.   11/17/22 (eval): SLP developed a specialized HEP for pt to focus on labial and cheek movement. SLP demonstrated each exercise and pt attempted each exercise after SLP. Lip ROM exercises were very challenging for pt but he attempted. SLP told pt to attempt x3-4 and if unable to complete move on to next exercise. SLP further suggested pt complete 2-3 of the exercises in front of a mirror to notate improvement in labial movement.  PATIENT EDUCATION: Education details: see today's treatment Person educated: Patient Education method: Explanation Education comprehension: verbalized understanding   GOALS: Goals reviewed with patient? Yes  SHORT TERM GOALS: Target date: 12/17/22  Pt will complete HEP with independence x2 sessions Baseline: 11/22/22 Goal status: Met  2.   Pt will maintain functional compensations for speech and swallowing x2 sessions  Baseline:  11/22/22 Goal status: Met  3.  Pt will complete PROM Baseline:  Goal status: met   LONG TERM GOALS: Target date: 12/31/22  Pt will complete HEP with independence after 12/17/22 x2 sessions Baseline: 01/05/23 Goal status: met  2.  Pt will maintain functional compensations for speech and swallowing x2 sessions after 12/17/22 Baseline:  Goal status: Met  3.  Pt will improve with PROM score compared to initial score Baseline:  Goal status: deferred - pt left before filling out PROM    ASSESSMENT:  CLINICAL IMPRESSION: Patient is a 38 y.o. M who cont to be seen for treatment of swallowing and speech in light of an incident of GBS. Pt cont to use compensatory measures in swallowing and speech - he was again 100% intelligible, and no labial leakage with liquids observed. He agreed with d/c today.    OBJECTIVE IMPAIRMENTS: include dysarthria and dysphagia. These impairments are limiting patient from return to work, household responsibilities, ADLs/IADLs, effectively communicating  at home and in community, and safety when swallowing. Factors affecting potential to achieve goals and functional outcome are  questionable recovery timeline . Patient will benefit from skilled SLP services to address above impairments and improve overall function.  REHAB POTENTIAL: Good  PLAN: Discharge today.  PLANNED INTERVENTIONS: Diet toleration management , Environmental controls, Oral motor exercises, SLP instruction and feedback, Compensatory strategies, and Patient/family education    [facility], CCC-SLP 03/15/2023, 5:12 PM

## 2023-05-03 DIAGNOSIS — L280 Lichen simplex chronicus: Secondary | ICD-10-CM | POA: Diagnosis not present

## 2023-08-05 NOTE — Progress Notes (Signed)
 I saw Alexander Mcgrath in neurology clinic on 08/12/23 in follow up for GBS.  HPI: Alexander Mcgrath is a 38 y.o. 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  To briefly review: 11/12/22: Patient's baseline before recent illness was wheelchair bound, but independent of transfers, driving, working as a Psychologist, occupational.   Patient first presented to the ED on 10/08/22 for numbness in the left leg and numbness and tingling in bilateral hands. There was also some increase in weakness though patient is wheelchair bound, so extent at that time was unclear. His strength in bilateral UE was 5/5 and cranial nerves were intact per documentation that day. CT head was unremarkable. Given that he worked as a Psychologist, occupational, the thought was it was working with his hands that was causing symptoms. Patient was discharged home.   He represented to the ED on 10/12/22 for ascending weakness. He noticed he was no longer able to transfer himself into his wheelchair. Per documentation, he was having difficulty communicating and left eye was "fuzzy" (blurry vision). He was unable to use facial muscles to smile. He had a hard time talking and swallowing. His upper extremities were now weak as well. There was concern for AIDP, nutritional deficiency, or meningitis, so patient was admitted. MRI brain and cervical spine were unremarkable. The plan was for LP, but even by IR, no CSF was able to be obtained. Given the high suspicion of AIDP, decision was made to start IVIg on 10/13/22 for 5 sessions. Patient had improvement of strength in upper extremities prior to discharge. Patient started feeling better after the 4th treatment. Patient also had elevated Coxsackie titers, which are of unclear significance to me and were not mentioned in hospital notes that I can find (maybe all IgG and old infection?).    Patient was discharged to rehab where he got PT, OT, speech therapy. He continued  to show improvement per documentation. His exam was as follows at dc from rehab: Right upper extremity biceps 4+/5 triceps 4+/5 wrist extension 4+/5 grip 4/5 FA 2+/5 Left upper extremity biceps 4/5 triceps 3+/5W EE 4 -/5 grip 3+/5 FA 2/5 Lower extremities trace movement in hip flexors/knee extension on left lower extremity but otherwise 0/5 Neurologic.  Alert oriented x 3 speech dysarthric but intelligible   Per patient, he continues to improve. He thinks his arms are much stronger. He still has a little bit of numbness and tingling in his fingers. Patient thinks his face the major area that has not improved as much. It may be improving, but not as much as his hands. He feels the right side of his face is very weak. He has no tingling and maybe not numbness. He thinks the forehead is fine, just the right lower face. He is swallowing okay. He can eat normal food, but has to be slow about it.    He denies any regression of symptoms. He has returned to work. He reports no trouble. He is driving again (hand controls). He is also returning to wheelchair basketball. He feels some weakness still while doing this.   Patient has not had similar symptoms in the past.   He is scheduled to establish with physical and occupational therapy next week. He does not think he has speech therapy.   Of note, patient has bowel and bladder normal function at baseline. This was never affected during his illness.   EtOH use: None  Restrictive diet?  Does not eat a lot of animal protein  02/11/23: Patient continues to feel like lips are not moving as well as prior to GBS. He has significantly improved though. He has muscle twitching in right face. He still has issues with chewing and swallowing, particularly on the right side of mouth, but again, but improved over 6 months. He finds his lips are very dry often.   He feels arms and legs are about 97% normal. He denies numbness or tingling in extremities, but does have some  shakiness of both hands.   He finished PT/OT. He continues to do speech therapy, now every 8 weeks.  Most recent Assessment and Plan (02/11/23): This is Alexander Mcgrath, a 37 y.o. male with acute onset numbness, weakness, and difficulty swallowing in 10/2022. 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 His neurological examination is pertinent for bilateral significant facial weakness, atrophy and weakness of intrinsic hand muscles, and chronic paralysis of lower extremities. 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 He received 5 days of IVIg and had inpatient rehab with PT, OT, and speech therapy. He continues to improve, but has some residual facial weakness, right more than left.    He also has intrinsic hand muscle atrophy that is likely more chronic as this level of atrophy would not be expected as a result of an acute process. 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    Plan: -Continue home PT/OT exercises -Continue speech therapy and home exercises -Patient to contact me if there is any worsening -Discussed not trying to lean on elbows  Since their last visit: Patient finished speech therapy. He still has some weakness of the lips, but is improved. He has occasional twitching of the muscles in his face. He has very minimal issues with swallowing. He denies any numbness and tingling.  He has no new complaints.   MEDICATIONS:  Outpatient Encounter Medications as of 08/12/2023  Medication Sig   Multiple Vitamin (MULTIVITAMIN WITH MINERALS) TABS tablet Take 1 tablet by mouth daily.   acetaminophen  (TYLENOL ) 325 MG tablet Take 2 tablets (650 mg total) by mouth every 6 (six) hours as needed for mild pain, moderate pain or headache. (Patient not taking: Reported on 12/08/2022)    gabapentin  (NEURONTIN ) 300 MG capsule Take 1 capsule (300 mg total) by mouth 2 (two) times daily. (Patient not taking: Reported on 02/11/2023)   ibuprofen  (ADVIL ) 200 MG tablet Take 400 mg by mouth every 6 (six) hours as needed for headache or moderate pain. (Patient not taking: Reported on 12/08/2022)   No facility-administered encounter medications on file as of 08/12/2023.    PAST MEDICAL HISTORY: Past Medical History:  Diagnosis Date   Polio    TB of vertebral column     PAST SURGICAL HISTORY: Past Surgical History:  Procedure Laterality Date   ORIF FEMUR FRACTURE     SPINAL FUSION     TIBIA IM NAIL INSERTION Left     ALLERGIES: No Known Allergies  FAMILY HISTORY: History reviewed. No pertinent family history.  SOCIAL HISTORY: Social History   Tobacco Use   Smoking status: Never   Smokeless tobacco: Never  Vaping Use   Vaping status: Never Used  Substance Use Topics   Alcohol  use: Yes    Comment: occas   Social History  Social History Narrative   Are you right handed or left handed? Right   Are you currently employed ? 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    What type of home do you live in: 1 story or 2 story? one    Caffeine 2 cups a day     Objective:  Vital Signs:  BP 119/77   Pulse 82   Ht 5\' 1"  (1.549 m)   Wt 100 lb (45.4 kg)   SpO2 98%   BMI 18.89 kg/m   General: General appearance: Awake and alert. No distress. Cooperative with exam.  Skin: No obvious rash or jaundice. HEENT: Atraumatic. Anicteric. Lungs: Non-labored breathing on room air   Neurological: Mental Status: Alert. Speech fluent. No pseudobulbar affect Cranial Nerves: CNII: No RAPD. Visual fields intact. CNIII, IV, VI: PERRL. No nystagmus. EOMI. CN V: Facial sensation intact bilaterally to fine touch. CN VII: Facial muscles symmetric and strong, except for orbicularis oris. Unable to hold air in mouth. No  ptosis at rest. CN VIII: Hears finger rub well bilaterally. CN IX: No hypophonia. CN X: Palate elevates symmetrically. CN XI: Full strength shoulder shrug bilaterally. CN XII: Tongue protrusion full and midline. No atrophy or fasciculations. No significant dysarthria Motor: Atrophy in bilateral FDI, ADM, and APB (R > L).  Individual muscle group testing (MRC grade out of 5):   Movement        Neck flexion 5      Neck extension 5        Right Left    Shoulder abduction 5 5    Elbow flexion 5 5    Elbow extension 5 5    Wrist extension 5 5    Wrist flexion 5 5    Finger abduction - FDI 4 4+    Finger abduction - ADM 4 4+    Finger extension 5 5    Finger distal flexion - 2/3 5 5     Finger distal flexion - 4/5 5 5     Thumb flexion - FPL 5 5    Thumb abduction - APB 4 4      No movement in right leg; able to twist some in left leg, but minimal    Reflexes: Trace throughout Sensation: Pinprick: intact in all extremities Coordination: Intact finger-to- nose-finger bilaterally. Gait: Wheelchair bound at baseline, unable to ambulate.   Lab and Test Review: No new results  Previously reviewed results: 10/12/22: B12: 436 B1 wnl B6 wnl RPR and HIV non-reactive SPEP: no M protein ESR and CRP wnl Heavy metals (arsenic, mercury, lead): wnl CMV wnl Porphyrins wnl Mg, phos wnl TSH wnl Folate wnl   Component     Latest Ref Rng 10/12/2022  Coxsackie A7 IgG     Neg:<1:100 titer 1:800 (H)   Coxsackie A9 IgG     Neg:<1:100 titer 1:800 (H)   Coxsackie A16 IgG     Neg:<1:100 titer 1:800 (H)   Coxsackie A24 IgG     Neg:<1:100 titer 1:800 (H)   Coxsackie A7 IgM     Neg:<1:10 titer Negative   Coxsackie A9 IgM     Neg:<1:10 titer Negative   Coxsackie A16 IgM     Neg:<1:10 titer Negative   Coxsackie A24 IgM     Neg:<1:10 titer Negative     Component     Latest Ref Rng 10/12/2022  COXSACKIE B1 AB  Neg:<1:100  1:100 (H)   COXSACKIE B2 AB     Neg:<1:100  1:100 (H)    COXSACKIE B3 AB     Neg:<1:100  1:100 (H)   COXSACKIE B4 AB     Neg:<1:100  1:500 (H)   COXSACKIE B5 AB     Neg:<1:100  1:1000 (H)   COXSACKIE B6 AB     Neg:<1:100  1:500 (H)     10/20/22: CMP unremarkable CBC w/ diff unremarkable   Imaging: CT head wo contrast (10/08/22): FINDINGS: Brain: There is no evidence for acute hemorrhage, hydrocephalus, mass lesion, or abnormal extra-axial fluid collection. No definite CT evidence for acute infarction.   Vascular: No hyperdense vessel or unexpected calcification.   Skull: No evidence for fracture. No worrisome lytic or sclerotic lesion.   Sinuses/Orbits: The visualized paranasal sinuses and mastoid air cells are clear. Visualized portions of the globes and intraorbital fat are unremarkable.   Other: None.   IMPRESSION: Unremarkable head CT. No acute intracranial abnormality.   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 No fracture or spondylolisthesis is noted. Severe dextroscoliosis of thoracic spine is noted. Fusion of multiple disc spaces in the lower thoracic and upper lumbar spine is noted.   IMPRESSION: Postsurgical changes as noted above.  No acute abnormality seen.   MRI brain w/wo contrast (10/12/22): FINDINGS: Brain: No acute infarct, mass effect or extra-axial collection. No acute or chronic hemorrhage. Normal white matter signal, parenchymal volume and CSF spaces. The midline structures are normal.   Vascular: Major flow voids are preserved.   Skull and upper cervical spine: Normal calvarium and skull base. Visualized upper cervical spine and soft tissues are normal.   Sinuses/Orbits:No paranasal sinus fluid levels or advanced mucosal thickening. No mastoid or middle ear effusion. Normal orbits.   IMPRESSION: Normal brain MRI.   MRI cervical spine w/wo contrast (10/12/22): FINDINGS: Alignment: There is mild kyphotic angulation  centered at C4-5. No sagittal spondylolisthesis. The atlantodens interval is intact.   Vertebrae: Vertebral body heights are maintained. Minimal anterior C5-6 and C6-7 disc space narrowing. w within the limitations of patient motion artifact, no significant bone signal abnormality is seen.   Cord: The cervical cord demonstrates normal signal and caliber. No abnormal cord enhancement.   Posterior Fossa, vertebral arteries, paraspinal tissues: Negative.   Disc levels:   C2-3: Mild bilateral uncovertebral hypertrophy. No posterior disc bulge. No central canal or neuroforaminal stenosis.   C3-4: Mild bilateral facet joint hypertrophy. Mild left uncovertebral hypertrophy. Borderline mild left neuroforaminal stenosis. The right neural foramen is patent. No central canal stenosis.   C4-5: Mild bilateral facet joint hypertrophy. No posterior disc bulge, central canal narrowing, or neuroforaminal stenosis.   C5-6: Mild bilateral facet joint hypertrophy. Mild posterior disc bulge with mild bilateral intraforaminal extension. No significant neuroforaminal stenosis. No central canal stenosis.   C6-7: Mild posterior disc bulge. CSF is still seen ventral to the cord. No central canal or neuroforaminal stenosis.   C7-T1: No posterior disc bulge, central canal narrowing, or neuroforaminal stenosis.   T1-2: Unremarkable.   IMPRESSION: 1. Mild multilevel degenerative disc and joint changes as above. 2. Borderline mild left C3-4 neuroforaminal stenosis. 3. No central canal stenosis.   Lumbar puncture (10/13/22): With the patient prone, the lower back was prepped with Betadine. 1% Lidocaine  was used for local anesthesia. Lumbar puncture was attempted at the L3/4 level using a 20 gauge needle, unsuccessful.   Second lumbar puncture  was attempted at the L4-L5 level, again unsuccessful. Dr. Areatha Ku was called to the procedure room for assistance. Dr. Areatha Ku attempted lumbar puncture at the  L4-L5 level, unsuccessful. Despite optimal needle positioning (example series 4) CSF did not return.   Another lumbar puncture attempt was made at the L5-S1 level, patient developed shooting sensation in his left leg, however CSF could not be obtained. Unsuccessful fluoro guided lumbar puncture.   The patient tolerated the procedure well and there were no apparent complications.   IMPRESSION: Unsuccessful fluoro guided lumbar puncture, as detailed above. Exam complicated by extensive thoracolumbar spine hardware.  ASSESSMENT: This is Alexander Mcgrath, a 38 y.o. male with acute onset numbness, weakness, and difficulty swallowing in 10/2022. 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 He received 5 days of IVIg and had inpatient rehab with PT, OT, and speech therapy. He continues to improve, but has mild residual facial weakness (orbicularis oris - mouth closure). We discussed that GBS is usually monophasic with low risk of recurrence.    He also has intrinsic hand muscle atrophy that is likely more chronic as this level of atrophy would not be expected as a result of an acute process. 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   Plan: -Continue home PT/OT/Speech therapy exercises -Patient to contact me with any new or worsening symptoms -Discussed to not lean on elbows  Return to clinic as needed  Total time spent reviewing records, interview, history/exam, documentation, and coordination of care on day of encounter:  25 min  Rommie Coats, MD

## 2023-08-12 ENCOUNTER — Ambulatory Visit: Payer: BC Managed Care – PPO | Admitting: Neurology

## 2023-08-12 ENCOUNTER — Encounter: Payer: Self-pay | Admitting: Neurology

## 2023-08-12 VITALS — BP 119/77 | HR 82 | Ht 61.0 in | Wt 100.0 lb

## 2023-08-12 DIAGNOSIS — R1310 Dysphagia, unspecified: Secondary | ICD-10-CM

## 2023-08-12 DIAGNOSIS — G610 Guillain-Barre syndrome: Secondary | ICD-10-CM

## 2023-08-12 DIAGNOSIS — R29810 Facial weakness: Secondary | ICD-10-CM

## 2023-08-12 DIAGNOSIS — G5623 Lesion of ulnar nerve, bilateral upper limbs: Secondary | ICD-10-CM | POA: Diagnosis not present

## 2023-08-12 DIAGNOSIS — G5603 Carpal tunnel syndrome, bilateral upper limbs: Secondary | ICD-10-CM
